# Patient Record
Sex: Female | Born: 1970 | Race: White | Hispanic: No | Marital: Married | Smoking: Never smoker
Health system: Southern US, Community
[De-identification: ages and names within clinical notes are randomized; demographics above are authoritative.]

## PROBLEM LIST (undated history)

## (undated) DIAGNOSIS — E039 Hypothyroidism, unspecified: Secondary | ICD-10-CM

## (undated) DIAGNOSIS — K219 Gastro-esophageal reflux disease without esophagitis: Secondary | ICD-10-CM

## (undated) DIAGNOSIS — T753XXA Motion sickness, initial encounter: Secondary | ICD-10-CM

## (undated) DIAGNOSIS — F419 Anxiety disorder, unspecified: Secondary | ICD-10-CM

## (undated) DIAGNOSIS — T8859XA Other complications of anesthesia, initial encounter: Secondary | ICD-10-CM

## (undated) DIAGNOSIS — E119 Type 2 diabetes mellitus without complications: Secondary | ICD-10-CM

## (undated) DIAGNOSIS — M109 Gout, unspecified: Secondary | ICD-10-CM

## (undated) DIAGNOSIS — M1990 Unspecified osteoarthritis, unspecified site: Secondary | ICD-10-CM

## (undated) HISTORY — PX: ABDOMINAL HYSTERECTOMY: SHX81

## (undated) HISTORY — PX: BARIATRIC SURGERY: SHX1103

## (undated) HISTORY — PX: TONSILLECTOMY: SUR1361

## (undated) HISTORY — PX: CATARACT EXTRACTION, BILATERAL: SHX1313

## (undated) HISTORY — PX: CARPAL TUNNEL RELEASE: SHX101

---

## 2011-09-14 ENCOUNTER — Ambulatory Visit: Payer: Self-pay

## 2012-04-12 ENCOUNTER — Ambulatory Visit: Payer: Self-pay | Admitting: Internal Medicine

## 2012-04-12 LAB — URINALYSIS, COMPLETE
Bilirubin,UR: NEGATIVE
Ketone: NEGATIVE
Nitrite: NEGATIVE
Ph: 5 (ref 4.5–8.0)
Squamous Epithelial: NONE SEEN
WBC UR: >30 /HPF (ref 0–5)

## 2012-04-14 LAB — URINE CULTURE

## 2013-04-09 ENCOUNTER — Ambulatory Visit: Payer: Self-pay

## 2013-04-12 DIAGNOSIS — M179 Osteoarthritis of knee, unspecified: Secondary | ICD-10-CM | POA: Insufficient documentation

## 2015-02-17 DIAGNOSIS — G5602 Carpal tunnel syndrome, left upper limb: Secondary | ICD-10-CM | POA: Insufficient documentation

## 2016-10-18 ENCOUNTER — Encounter: Payer: Self-pay | Admitting: Emergency Medicine

## 2016-10-18 ENCOUNTER — Ambulatory Visit
Admission: EM | Admit: 2016-10-18 | Discharge: 2016-10-18 | Disposition: A | Discharge status: Home / Self Care | Payer: 59 | Attending: Family Medicine | Admitting: Family Medicine

## 2016-10-18 DIAGNOSIS — B349 Viral infection, unspecified: Secondary | ICD-10-CM

## 2016-10-18 HISTORY — DX: Type 2 diabetes mellitus without complications: E11.9

## 2016-10-18 HISTORY — DX: Unspecified osteoarthritis, unspecified site: M19.90

## 2016-10-18 HISTORY — DX: Gout, unspecified: M10.9

## 2016-10-18 HISTORY — DX: Anxiety disorder, unspecified: F41.9

## 2016-10-18 LAB — RAPID STREP SCREEN (MED CTR MEBANE ONLY): Streptococcus, Group A Screen (Direct): NEGATIVE

## 2016-10-18 MED ORDER — PREDNISONE 10 MG (21) PO TBPK: 10.0000 mg | ORAL_TABLET | Freq: Every day | ORAL | 0 refills | Status: DC

## 2016-10-18 NOTE — ED Triage Notes (Signed)
Patient c/o bodyaches and chills that started on Wed.  Patient denies fevers.

## 2016-10-18 NOTE — ED Provider Notes (Signed)
CSN: [id number]     Arrival date & time 10/18/16  0917 History   None    Chief Complaint  Patient presents with  . Generalized Body Aches   (Consider location/radiation/quality/duration/timing/severity/associated sxs/prior Treatment) Sore throat and body aches for 2 days. Was concerned due to there son was positive for the flu. Denies any n/v/d no fevers. Does not smoke. Has not taken anything PTA.       Past Medical History:  Diagnosis Date  . Anxiety   . Arthritis   . Diabetes mellitus without complication (HCC)   . Gout    Past Surgical History:  Procedure Laterality Date  . ABDOMINAL HYSTERECTOMY    . BARIATRIC SURGERY    . TONSILLECTOMY     History reviewed. No pertinent family history. Social History  Substance Use Topics  . Smoking status: Never Smoker  . Smokeless tobacco: Never Used  . Alcohol use Yes   OB History    No data available     Review of Systems  Constitutional: Negative.   HENT: Positive for rhinorrhea and sore throat.   Eyes: Negative.   Respiratory: Negative.   Cardiovascular: Negative.   Gastrointestinal: Negative.   Genitourinary: Negative.   Musculoskeletal:       Body aches generalized   Skin: Negative.   Neurological: Negative.     Allergies  Patient has no known allergies.  Home Medications   Prior to Admission medications   Medication Sig Start Date End Date Taking? Authorizing Provider  buPROPion (WELLBUTRIN) 75 MG tablet Take 75 mg by mouth 2 (two) times daily.   Yes Historical Provider, MD  Canagliflozin (INVOKANA PO) Take by mouth daily.   Yes Historical Provider, MD  clonazePAM (KLONOPIN) 0.5 MG tablet Take 0.5 mg by mouth as needed for anxiety.   Yes Historical Provider, MD  cyclobenzaprine (FLEXERIL) 10 MG tablet Take 10 mg by mouth 3 (three) times daily as needed for muscle spasms.   Yes Historical Provider, MD  metFORMIN (GLUCOPHAGE) 1000 MG tablet Take 1,000 mg by mouth 2 (two) times daily with a meal.   Yes  Historical Provider, MD  sertraline (ZOLOFT) 100 MG tablet Take 100 mg by mouth daily.   Yes Historical Provider, MD  predniSONE (STERAPRED UNI-PAK 21 TAB) 10 MG (21) TBPK tablet Take 1 tablet (10 mg total) by mouth daily. Take 6 tabs by mouth daily  for 2 days, then 5 tabs for 2 days, then 4 tabs for 2 days, then 3 tabs for 2 days, 2 tabs for 2 days, then 1 tab by mouth daily for 2 days 10/18/16   Tobi BastosMelanie A Neah Sporrer, NP   Meds Ordered and Administered this Visit  Medications - No data to display  BP 105/71 (BP Location: Left Arm)   Pulse 83   Temp 98.6 F (37 C) (Oral)   Resp 16   Ht 5\' 4"  (1.626 m)   Wt 160 lb (72.6 kg)   SpO2 98%   BMI 27.46 kg/m  No data found.   Physical Exam  Constitutional: She appears well-developed.  HENT:  Right Ear: External ear normal.  Left Ear: External ear normal.  Nose: Nose normal.  Mouth/Throat: Oropharynx is clear and moist.  Eyes: Pupils are equal, round, and reactive to light.  Neck: Normal range of motion.  Cardiovascular: Normal heart sounds.   Pulmonary/Chest: Effort normal and breath sounds normal.  Abdominal: Soft. Bowel sounds are normal.  Musculoskeletal: Normal range of motion.  Neurological: She is alert.  Skin: Skin is warm.    Urgent Care Course     Procedures (including critical care time)  Labs Review Labs Reviewed  RAPID STREP SCREEN (NOT AT [facility])  CULTURE, GROUP A STREP [facility])    Imaging Review No results found.           MDM   1. Viral illness    Your step is negative we will send for a culture and call you if it is positive.  You appear to have viral type symptoms We discussed you taking steroids and you have taken these in the past with no problems Monitor you sugars due to steroids can cause an elevation     Tobi Bastos, NP 10/18/16 1046

## 2016-10-18 NOTE — Discharge Instructions (Signed)
Your step is negative we will send for a culture and call you if it is positive.  You appear to have viral type symptoms We discussed you taking steroids and you have taken these in the past with no problems Monitor you sugars due to steroids can cause an elevation

## 2016-10-21 LAB — CULTURE, GROUP A STREP (THRC)

## 2017-02-17 DIAGNOSIS — D509 Iron deficiency anemia, unspecified: Secondary | ICD-10-CM | POA: Insufficient documentation

## 2018-05-03 ENCOUNTER — Ambulatory Visit
Admission: EM | Admit: 2018-05-03 | Discharge: 2018-05-03 | Disposition: A | Discharge status: Home / Self Care | Payer: Managed Care, Other (non HMO)

## 2018-05-03 ENCOUNTER — Ambulatory Visit (INDEPENDENT_AMBULATORY_CARE_PROVIDER_SITE_OTHER): Payer: Managed Care, Other (non HMO)

## 2018-05-03 DIAGNOSIS — M7712 Lateral epicondylitis, left elbow: Secondary | ICD-10-CM

## 2018-05-03 DIAGNOSIS — S59912A Unspecified injury of left forearm, initial encounter: Secondary | ICD-10-CM

## 2018-05-03 DIAGNOSIS — W19XXXA Unspecified fall, initial encounter: Secondary | ICD-10-CM

## 2018-05-03 MED ORDER — MELOXICAM 7.5 MG PO TABS: 7.5000 mg | ORAL_TABLET | Freq: Two times a day (BID) | ORAL | 0 refills | Status: AC

## 2018-05-03 NOTE — Discharge Instructions (Addendum)
Your imaging was negative for fracture today. Presentation is consistent with lateral epicondylitis. See handout. Advise rest, icing painful area, using compression ACE wrap. Also avoid grasping and lifting greater than 2 lbs until pain improves. F/u with PCP in 1 week or sooner for re-examination.Take meloxicam as prescribed and may also take Tylenol for additional pain relief.

## 2018-05-03 NOTE — ED Triage Notes (Signed)
Pt fell over a week ago when she tripped over a speed bump and tried to catch her self with her left arm and still having pain in the left arm. ROM makes the pain returns, hand and arm weakness. Has been taking ibuprofen with some slight relief.

## 2018-05-03 NOTE — ED Provider Notes (Signed)
[facility]    CSN: [id number] Arrival date & time: 05/03/18  4098     History   Chief Complaint Chief Complaint  Patient presents with  . Fall    HPI Lauren Phillips is a 47 y.o. female. Patient presents with 1 week of left forearm pain that is worsening. States she fell on her outstretched arm last week and has had pain of the left wrist and forearm since. Patient states she tripped over a speed bump when walking. Admits to pain with supination and pronation of elbow. Admits to painful gripping and weakness of the arm. Denies numbness/tingling. States she does have some swelling of her elbow. Pain is 8/10. She has been taking NSAIDs and icing the arm without relief. States she would like to have x-rays to make sure she did not break anything. Denies any other injuries and has no further complaints.  HPI   Past Medical History:  Diagnosis Date  . Anxiety   . Arthritis   . Diabetes mellitus without complication (HCC)   . Gout     There are no active problems to display for this patient.   Past Surgical History:  Procedure Laterality Date  . ABDOMINAL HYSTERECTOMY    . BARIATRIC SURGERY    . TONSILLECTOMY      OB History   None      Home Medications    Prior to Admission medications   Medication Sig Start Date End Date Taking? Authorizing Provider  buPROPion (WELLBUTRIN) 75 MG tablet Take 75 mg by mouth 2 (two) times daily.   Yes [provider]  Canagliflozin (INVOKANA PO) Take by mouth daily.   Yes [provider]  clonazePAM (KLONOPIN) 0.5 MG tablet Take 0.5 mg by mouth as needed for anxiety.   Yes [provider]  cyclobenzaprine (FLEXERIL) 10 MG tablet Take 10 mg by mouth 3 (three) times daily as needed for muscle spasms.   Yes [provider]  levothyroxine (SYNTHROID, LEVOTHROID) 50 MCG tablet TK 1 T PO QD 04/21/18  Yes [provider]  metFORMIN (GLUCOPHAGE) 1000 MG tablet Take 1,000 mg by mouth 2  (two) times daily with a meal.   Yes [provider]  sertraline (ZOLOFT) 100 MG tablet Take 100 mg by mouth daily.   Yes [provider]  predniSONE (STERAPRED UNI-PAK 21 TAB) 10 MG (21) TBPK tablet Take 1 tablet (10 mg total) by mouth daily. Take 6 tabs by mouth daily  for 2 days, then 5 tabs for 2 days, then 4 tabs for 2 days, then 3 tabs for 2 days, 2 tabs for 2 days, then 1 tab by mouth daily for 2 days 10/18/16   Coralyn Mark, NP    Family History No family history on file.  Social History Social History   Tobacco Use  . Smoking status: Never Smoker  . Smokeless tobacco: Never Used  Substance Use Topics  . Alcohol use: Yes  . Drug use: Not on file     Allergies   Patient has no known allergies.   Review of Systems Review of Systems  Constitutional: Negative for fatigue and fever.  Respiratory: Negative for shortness of breath.   Cardiovascular: Negative for chest pain.  Musculoskeletal: Positive for arthralgias (left elbow, left wrist) and joint swelling (lateral left elbow).  Skin: Negative for rash and wound.  Neurological: Positive for weakness (left arm). Negative for dizziness and headaches.  Hematological: Does not bruise/bleed easily.  Physical Exam Triage Vital Signs ED Triage Vitals  Enc Vitals Group     BP 05/03/18 0850 123/83     Pulse Rate 05/03/18 0850 77     Resp 05/03/18 0850 18     Temp 05/03/18 0850 98 F (36.7 C)     Temp Source 05/03/18 0850 Oral     SpO2 05/03/18 0850 97 %     Weight 05/03/18 0852 180 lb (81.6 kg)     Height --      Head Circumference --      Peak Flow --      Pain Score 05/03/18 0852 8     Pain Loc --      Pain Edu? --      Excl. in GC? --    No data found.  Updated Vital Signs BP 123/83 (BP Location: Right Arm)   Pulse 77   Temp 98 F (36.7 C) (Oral)   Resp 18   Wt 180 lb (81.6 kg)   SpO2 97%   BMI 30.90 kg/m       Physical Exam  Constitutional: She is oriented to person,  place, and time. She appears well-developed and well-nourished.  HENT:  Head: Normocephalic and atraumatic.  Eyes: Pupils are equal, round, and reactive to light. Conjunctivae are normal.  Neck: Neck supple.  Cardiovascular: Normal rate, regular rhythm and normal pulses.  No murmur heard. Pulmonary/Chest: Effort normal and breath sounds normal. She has no wheezes. She has no rales.  Musculoskeletal:  Left elbow: there is mild effusion and ecchymosis of the elbow at lateral epicondyle and extensonr tendons of forearm, Full ROM of elbow but has pain and guarding with supination>pronation. 4/5 grip strength compared to normal strength on right. NVI Left wrist: no effusion or ecchymosis. TTP distal ulnar process, full ROM of wrist, full strength of wrist. NVI Left hand: all normal Left shoulder: all normal  Neurological: She is alert and oriented to person, place, and time. No sensory deficit.  Skin: Skin is warm and dry.  Psychiatric: She has a normal mood and affect. Her behavior is normal.     UC Treatments / Results  Labs (all labs ordered are listed, but only abnormal results are displayed) Labs Reviewed - No data to display  EKG None  Radiology Dg Elbow Complete Left  Result Date: 05/03/2018 CLINICAL DATA:  Fall on outstretched arm with left elbow pain EXAM: LEFT ELBOW - COMPLETE 3+ VIEW COMPARISON:  None. FINDINGS: There is no evidence of fracture, dislocation, or joint effusion. There is no evidence of arthropathy or other focal bone abnormality. Soft tissues are unremarkable. IMPRESSION: No left elbow fracture, joint effusion or dislocation. Electronically Signed   By: Delbert Phenix M.D.   On: 05/03/2018 09:24   Dg Forearm Left  Result Date: 05/03/2018 CLINICAL DATA:  Recent fall.  Left forearm pain. EXAM: LEFT FOREARM - 2 VIEW COMPARISON:  None. FINDINGS: There is no evidence of fracture or other focal bone lesions. Soft tissues are unremarkable. IMPRESSION: Negative.  Electronically Signed   By: Delbert Phenix M.D.   On: 05/03/2018 09:25    Procedures Procedures (including critical care time)  Medications Ordered in UC Medications - No data to display  Initial Impression / Assessment and Plan / UC Course  I have reviewed the triage vital signs and the nursing notes.  Pertinent labs & imaging results that were available during my care of the patient were reviewed by me and considered in my medical decision  making (see chart for details).   Patient presented with tenderness of the distal ulna and lateral epicondyle of elbow post fall 1 week ago. Patient also had decreased strength of affected arm and painful ROM. Ketorolac injection offered for pain relief, but patient declined. Imaging of left elbow and forearm ordered. Imaging reviewed. Radiology reading negative for fractures or acute abnormality-agree with report. Condition consistent with lateral epicondylitis. Discussed results with patient and advised rest, ice, elevation and use of arm brace or sling until pain begins to improve. Take Meloxicam for inflammation and pain. F/u with PCP or ortho in 1 week (or sooner if worsens) for further detailed imaging to ensure no occult fracture.    Final Clinical Impressions(s) / UC Diagnoses   Final diagnoses:  Fall, initial encounter  Forearm injury, left, initial encounter   Discharge Instructions   None    ED Prescriptions    None     Controlled Substance Prescriptions [place] Controlled Substance Registry consulted? Not Applicable   Gareth Morganaves, Fannie Alomar B, PA-C 05/03/18 [id number]

## 2020-04-02 ENCOUNTER — Other Ambulatory Visit: Payer: Self-pay

## 2020-04-02 ENCOUNTER — Ambulatory Visit
Admission: EM | Admit: 2020-04-02 | Discharge: 2020-04-02 | Disposition: A | Discharge status: Home / Self Care | Payer: Managed Care, Other (non HMO) | Attending: Internal Medicine | Admitting: Internal Medicine

## 2020-04-02 ENCOUNTER — Encounter: Payer: Self-pay | Admitting: Emergency Medicine

## 2020-04-02 DIAGNOSIS — F419 Anxiety disorder, unspecified: Secondary | ICD-10-CM | POA: Insufficient documentation

## 2020-04-02 DIAGNOSIS — R0982 Postnasal drip: Secondary | ICD-10-CM | POA: Insufficient documentation

## 2020-04-02 DIAGNOSIS — E119 Type 2 diabetes mellitus without complications: Secondary | ICD-10-CM | POA: Insufficient documentation

## 2020-04-02 DIAGNOSIS — M1990 Unspecified osteoarthritis, unspecified site: Secondary | ICD-10-CM | POA: Diagnosis not present

## 2020-04-02 DIAGNOSIS — Z791 Long term (current) use of non-steroidal anti-inflammatories (NSAID): Secondary | ICD-10-CM | POA: Insufficient documentation

## 2020-04-02 DIAGNOSIS — Z79899 Other long term (current) drug therapy: Secondary | ICD-10-CM | POA: Insufficient documentation

## 2020-04-02 DIAGNOSIS — Z20822 Contact with and (suspected) exposure to covid-19: Secondary | ICD-10-CM | POA: Insufficient documentation

## 2020-04-02 DIAGNOSIS — Z7984 Long term (current) use of oral hypoglycemic drugs: Secondary | ICD-10-CM | POA: Diagnosis not present

## 2020-04-02 DIAGNOSIS — R05 Cough: Secondary | ICD-10-CM | POA: Insufficient documentation

## 2020-04-02 DIAGNOSIS — J069 Acute upper respiratory infection, unspecified: Secondary | ICD-10-CM | POA: Diagnosis not present

## 2020-04-02 MED ORDER — BENZONATATE 200 MG PO CAPS: 200.0000 mg | ORAL_CAPSULE | Freq: Two times a day (BID) | ORAL | 0 refills | Status: DC | PRN

## 2020-04-02 NOTE — ED Triage Notes (Signed)
Patient c/o cough, chest congestion and runny nose for 4-5 days.  Patient denies fevers.

## 2020-04-02 NOTE — Discharge Instructions (Addendum)
Try the saline rinses twice a day for 3-5 days Check your Mychart for Covid test results.

## 2020-04-02 NOTE — ED Provider Notes (Signed)
[facility]    CSN: [id number] Arrival date & time: 04/02/20  1446      History   Chief Complaint Chief Complaint  Patient presents with  . Cough    HPI Lauren Phillips is a 49 y.o. female. who presents with URI x 4 days and her cough is keeping herself up at night. Has been taking Dayquil for cough which helps, and something similar to Nyquil she is still coughing. Denies loss of taste or smell. Deneis fever or chills. Her R throat feels irritated from post nasal drainage.      Past Medical History:  Diagnosis Date  . Anxiety   . Arthritis   . Diabetes mellitus without complication (HCC)   . Gout     There are no problems to display for this patient.   Past Surgical History:  Procedure Laterality Date  . ABDOMINAL HYSTERECTOMY    . BARIATRIC SURGERY    . TONSILLECTOMY      OB History   No obstetric history on file.      Home Medications    Prior to Admission medications   Medication Sig Start Date End Date Taking? Authorizing Provider  buPROPion (WELLBUTRIN) 75 MG tablet Take 75 mg by mouth 2 (two) times daily.   Yes [provider]  clonazePAM (KLONOPIN) 0.5 MG tablet Take 0.5 mg by mouth as needed for anxiety.   Yes [provider]  FARXIGA 10 MG TABS tablet Take 10 mg by mouth daily. 03/30/20  Yes [provider]  levothyroxine (SYNTHROID, LEVOTHROID) 50 MCG tablet TK 1 T PO QD 04/21/18  Yes [provider]  meloxicam (MOBIC) 15 MG tablet Take 15 mg by mouth daily. 02/29/20  Yes [provider]  metFORMIN (GLUCOPHAGE) 1000 MG tablet Take 1,000 mg by mouth 2 (two) times daily with a meal.   Yes [provider]  sertraline (ZOLOFT) 100 MG tablet Take 100 mg by mouth daily.   Yes [provider]  benzonatate (TESSALON) 200 MG capsule Take 1 capsule (200 mg total) by mouth 2 (two) times daily as needed for cough. 04/02/20   Rodriguez-Southworth, Nettie Elm, PA-C  cyclobenzaprine (FLEXERIL) 10  MG tablet Take 10 mg by mouth 3 (three) times daily as needed for muscle spasms.    [provider]  omeprazole (PRILOSEC) 40 MG capsule Take 40 mg by mouth daily. 02/28/20   [provider]  Vitamin D, Ergocalciferol, (DRISDOL) 1.25 MG (50000 UNIT) CAPS capsule Take 50,000 Units by mouth once a week. 03/30/20   [provider]    Family History History reviewed. No pertinent family history.  Social History Social History   Tobacco Use  . Smoking status: Never Smoker  . Smokeless tobacco: Never Used  Vaping Use  . Vaping Use: Never used  Substance Use Topics  . Alcohol use: Yes  . Drug use: Not on file     Allergies   Patient has no known allergies.   Review of Systems Review of Systems  Constitutional: Negative for activity change, appetite change, chills, diaphoresis and fever.  HENT: Positive for congestion, postnasal drip, rhinorrhea and sore throat. Negative for ear discharge, ear pain and trouble swallowing.   Eyes: Negative for discharge.  Respiratory: Positive for cough. Negative for shortness of breath and wheezing.   Cardiovascular: Negative for chest pain.  Musculoskeletal: Negative for gait problem and myalgias.  Skin: Negative for rash.  Neurological: Negative for headaches.  Hematological: Negative for adenopathy.  Physical Exam Triage Vital Signs ED Triage Vitals  Enc Vitals Group     BP 04/02/20 1519 126/73     Pulse Rate 04/02/20 1519 84     Resp 04/02/20 1519 16     Temp 04/02/20 1519 98.6 F (37 C)     Temp Source 04/02/20 1519 Oral     SpO2 --      Weight 04/02/20 1514 180 lb (81.6 kg)     Height 04/02/20 1514 5\' 4"  (1.626 m)     Head Circumference --      Peak Flow --      Pain Score 04/02/20 1514 0     Pain Loc --      Pain Edu? --      Excl. in GC? --    No data found.  Updated Vital Signs BP 126/73 (BP Location: Right Arm)   Pulse 84   Temp 98.6 F (37 C) (Oral)   Resp 16   Ht 5\' 4"  (1.626 m)   Wt  180 lb (81.6 kg)   BMI 30.90 kg/m   Visual Acuity Right Eye Distance:   Left Eye Distance:   Bilateral Distance:    Right Eye Near:   Left Eye Near:    Bilateral Near:     Physical Exam Vitals and nursing note reviewed.  Constitutional:      General: She is not in acute distress.    Appearance: She is obese. She is not toxic-appearing.  HENT:     Head: Atraumatic.     Right Ear: Tympanic membrane, ear canal and external ear normal.     Left Ear: Tympanic membrane, ear canal and external ear normal.     Nose: Congestion and rhinorrhea present.  Eyes:     General: No scleral icterus.    Conjunctiva/sclera: Conjunctivae normal.  Cardiovascular:     Rate and Rhythm: Normal rate and regular rhythm.  Pulmonary:     Effort: Pulmonary effort is normal.     Breath sounds: Normal breath sounds.  Musculoskeletal:        General: Normal range of motion.     Cervical back: Neck supple.  Skin:    General: Skin is warm and dry.  Neurological:     Mental Status: She is alert and oriented to person, place, and time.     Gait: Gait normal.  Psychiatric:        Mood and Affect: Mood normal.        Behavior: Behavior normal.        Thought Content: Thought content normal.        Judgment: Judgment normal.      UC Treatments / Results  Labs (all labs ordered are listed, but only abnormal results are displayed) Labs Reviewed  SARS CORONAVIRUS 2 (TAT 6-24 HRS)    EKG   Radiology No results found.  Procedures Procedures (including critical care time)  Medications Ordered in UC Medications - No data to display  Initial Impression / Assessment and Plan / UC Course  I have reviewed the triage vital signs and the nursing notes. Covid test is pending She was educated how to do saline nose rinses and advised to try this for a few days. I prescribed her Tessalon perless for the cough.  Final Clinical Impressions(s) / UC Diagnoses   Final diagnoses:  Viral URI with cough      Discharge Instructions     Try the saline rinses twice a day for  3-5 days Check your Mychart for Covid test results.     ED Prescriptions    Medication Sig Dispense Auth. Provider   benzonatate (TESSALON) 200 MG capsule Take 1 capsule (200 mg total) by mouth 2 (two) times daily as needed for cough. 30 capsule Rodriguez-Southworth, Nettie Elm, PA-C     PDMP not reviewed this encounter.   Garey Ham, [place] 04/02/20 1557

## 2020-04-03 LAB — SARS CORONAVIRUS 2 (TAT 6-24 HRS): SARS Coronavirus 2: NEGATIVE

## 2020-06-16 ENCOUNTER — Other Ambulatory Visit: Payer: Self-pay

## 2020-06-16 ENCOUNTER — Ambulatory Visit
Admission: RE | Admit: 2020-06-16 | Discharge: 2020-06-16 | Disposition: A | Discharge status: Home / Self Care | Payer: Managed Care, Other (non HMO) | Source: Ambulatory Visit | Attending: Family Medicine | Admitting: Family Medicine

## 2020-06-16 VITALS — BP 130/77 | HR 80 | Temp 98.5°F | Resp 18 | Ht 64.0 in | Wt 180.0 lb

## 2020-06-16 DIAGNOSIS — H109 Unspecified conjunctivitis: Secondary | ICD-10-CM | POA: Diagnosis not present

## 2020-06-16 DIAGNOSIS — J029 Acute pharyngitis, unspecified: Secondary | ICD-10-CM | POA: Diagnosis not present

## 2020-06-16 LAB — GROUP A STREP BY PCR: Group A Strep by PCR: NOT DETECTED

## 2020-06-16 MED ORDER — CETIRIZINE HCL 10 MG PO TABS
10.0000 mg | ORAL_TABLET | Freq: Every day | ORAL | 0 refills | Status: AC
Start: 2020-06-16 — End: ?

## 2020-06-16 MED ORDER — CIPROFLOXACIN HCL 0.3 % OP SOLN: OPHTHALMIC | 0 refills | Status: DC

## 2020-06-16 MED ORDER — IPRATROPIUM BROMIDE 0.06 % NA SOLN: 2.0000 | Freq: Four times a day (QID) | NASAL | 0 refills | Status: AC | PRN

## 2020-06-16 NOTE — Discharge Instructions (Signed)
Medications as prescribed. ° °Take care ° °Dr. Tannar Broker  °

## 2020-06-16 NOTE — ED Provider Notes (Signed)
[facility]    CSN: [id number] Arrival date & time: 06/16/20  1052  History   Chief Complaint Chief Complaint  Patient presents with  . Appointment  . Sore Throat  . Conjunctivitis    right   HPI  49 year old female presents with the above complaints.  Patient reports a 3-week history of sore throat.  She states that this has been mild.  No medications or interventions tried.  Patient reports that today she developed right eye redness, swelling, and discharge.  She is concerned she has conjunctivitis.  Declines Covid testing.  No sick contacts.  No relieving factors.  No other complaints.  Past Medical History:  Diagnosis Date  . Anxiety   . Arthritis   . Diabetes mellitus without complication (HCC)   . Gout    Past Surgical History:  Procedure Laterality Date  . ABDOMINAL HYSTERECTOMY    . BARIATRIC SURGERY    . CARPAL TUNNEL RELEASE Bilateral   . CATARACT EXTRACTION, BILATERAL    . TONSILLECTOMY     OB History   No obstetric history on file.    Home Medications    Prior to Admission medications   Medication Sig Start Date End Date Taking? Authorizing Provider  buPROPion (WELLBUTRIN) 75 MG tablet Take 75 mg by mouth 2 (two) times daily.   Yes [provider]  clonazePAM (KLONOPIN) 0.5 MG tablet Take 0.5 mg by mouth as needed for anxiety.   Yes [provider]  cyclobenzaprine (FLEXERIL) 10 MG tablet Take 10 mg by mouth 3 (three) times daily as needed for muscle spasms.   Yes [provider]  FARXIGA 10 MG TABS tablet Take 10 mg by mouth daily. 03/30/20  Yes [provider]  levothyroxine (SYNTHROID, LEVOTHROID) 50 MCG tablet TK 1 T PO QD 04/21/18  Yes [provider]  meloxicam (MOBIC) 15 MG tablet Take 15 mg by mouth daily. 02/29/20  Yes [provider]  metFORMIN (GLUCOPHAGE) 1000 MG tablet Take 1,000 mg by mouth 2 (two) times daily with a meal.   Yes [provider]  omeprazole (PRILOSEC) 40  MG capsule Take 40 mg by mouth daily. 02/28/20  Yes [provider]  sertraline (ZOLOFT) 100 MG tablet Take 100 mg by mouth daily.   Yes [provider]  Vitamin D, Ergocalciferol, (DRISDOL) 1.25 MG (50000 UNIT) CAPS capsule Take 50,000 Units by mouth once a week. 03/30/20  Yes [provider]  cetirizine (ZYRTEC ALLERGY) 10 MG tablet Take 1 tablet (10 mg total) by mouth daily. 06/16/20   Tommie Sams, DO  ciprofloxacin (CILOXAN) 0.3 % ophthalmic solution Administer 1 drop, every 2 hours, while awake, for 2 days. Then 1 drop, every 4 hours, while awake, for the next 5 days. 06/16/20   Everlene Other G, DO  ipratropium (ATROVENT) 0.06 % nasal spray Place 2 sprays into both nostrils 4 (four) times daily as needed for rhinitis. 06/16/20   Tommie Sams, DO    Family History Family History  Problem Relation Age of Onset  . Osteoarthritis Mother   . Neuropathy Mother   . Diabetes Mother        pre-diabetes  . Melanoma Mother   . Prostate cancer Father   . Diabetes Father   . Hypertension Father   . Gout Father     Social History Social History   Tobacco Use  . Smoking status: Never Smoker  . Smokeless tobacco: Never Used  Vaping Use  . Vaping Use:  Never used  Substance Use Topics  . Alcohol use: Yes    Comment: rare  . Drug use: Never     Allergies   Patient has no known allergies.   Review of Systems Review of Systems  Constitutional: Negative.   HENT: Positive for sore throat.   Eyes: Positive for discharge and redness.   Physical Exam Triage Vital Signs ED Triage Vitals  Enc Vitals Group     BP 06/16/20 1115 130/77     Pulse Rate 06/16/20 1115 80     Resp 06/16/20 1115 18     Temp 06/16/20 1115 98.5 F (36.9 C)     Temp Source 06/16/20 1115 Oral     SpO2 06/16/20 1115 98 %     Weight 06/16/20 1117 180 lb (81.6 kg)     Height 06/16/20 1117 5\' 4"  (1.626 m)     Head Circumference --      Peak Flow --      Pain Score 06/16/20 1116 1      Pain Loc --      Pain Edu? --      Excl. in GC? --    Updated Vital Signs BP 130/77 (BP Location: Left Arm)   Pulse 80   Temp 98.5 F (36.9 C) (Oral)   Resp 18   Ht 5\' 4"  (1.626 m)   Wt 81.6 kg   SpO2 98%   BMI 30.90 kg/m   Visual Acuity Right Eye Distance: 20/25 Left Eye Distance: 20/25 Bilateral Distance: 20/20  Right Eye Near:   Left Eye Near:    Bilateral Near:     Physical Exam Vitals and nursing note reviewed.  Constitutional:      General: She is not in acute distress.    Appearance: Normal appearance. She is not ill-appearing.  HENT:     Head: Normocephalic and atraumatic.     Mouth/Throat:     Pharynx: Oropharynx is clear. No oropharyngeal exudate or posterior oropharyngeal erythema.  Eyes:     General:        Right eye: Discharge present.     Comments: Conjunctival injection noted of the right eye.  Cardiovascular:     Rate and Rhythm: Normal rate and regular rhythm.     Heart sounds: No murmur heard.   Neurological:     Mental Status: She is alert.  Psychiatric:        Mood and Affect: Mood normal.        Behavior: Behavior normal.    UC Treatments / Results  Labs (all labs ordered are listed, but only abnormal results are displayed) Labs Reviewed  GROUP A STREP BY PCR    EKG   Radiology No results found.  Procedures Procedures (including critical care time)  Medications Ordered in UC Medications - No data to display  Initial Impression / Assessment and Plan / UC Course  I have reviewed the triage vital signs and the nursing notes.  Pertinent labs & imaging results that were available during my care of the patient were reviewed by me and considered in my medical decision making (see chart for details).    49 year old female presents with conjunctivitis.  Strep PCR negative.  I suspect that her sore throat is from postnasal drip.  Treating with Zyrtec and Atrovent nasal spray.  Cipro ophthalmic for conjunctivitis.  Final Clinical  Impressions(s) / UC Diagnoses   Final diagnoses:  Bacterial conjunctivitis  Acute pharyngitis, unspecified etiology     Discharge  Instructions     Medications as prescribed.  Take care  Dr. Adriana Simas    ED Prescriptions    Medication Sig Dispense Auth. Provider   ciprofloxacin (CILOXAN) 0.3 % ophthalmic solution Administer 1 drop, every 2 hours, while awake, for 2 days. Then 1 drop, every 4 hours, while awake, for the next 5 days. 5 mL Junice Fei G, DO   cetirizine (ZYRTEC ALLERGY) 10 MG tablet Take 1 tablet (10 mg total) by mouth daily. 30 tablet Annamae Shivley G, DO   ipratropium (ATROVENT) 0.06 % nasal spray Place 2 sprays into both nostrils 4 (four) times daily as needed for rhinitis. 15 mL Tommie Sams, DO     PDMP not reviewed this encounter.   Tommie Sams, DO 06/16/20 1505

## 2020-06-16 NOTE — ED Triage Notes (Signed)
Patient in today c/o sore throat x 3 weeks and red, swollen right eye x 1 day. Patient has not taken any OTC medications. Patient has had the covid vaccines. Patient declines covid testing at this time.

## 2020-06-23 ENCOUNTER — Other Ambulatory Visit: Payer: Self-pay | Admitting: Family Medicine

## 2021-03-05 ENCOUNTER — Encounter: Payer: Self-pay | Admitting: Gastroenterology

## 2021-03-05 ENCOUNTER — Other Ambulatory Visit: Payer: Self-pay

## 2021-03-05 ENCOUNTER — Ambulatory Visit: Payer: Managed Care, Other (non HMO) | Admitting: Gastroenterology

## 2021-03-05 VITALS — BP 115/73 | HR 86 | Temp 98.3°F | Ht 64.0 in | Wt 182.2 lb

## 2021-03-05 DIAGNOSIS — K219 Gastro-esophageal reflux disease without esophagitis: Secondary | ICD-10-CM | POA: Diagnosis not present

## 2021-03-05 DIAGNOSIS — G8929 Other chronic pain: Secondary | ICD-10-CM

## 2021-03-05 DIAGNOSIS — Z1211 Encounter for screening for malignant neoplasm of colon: Secondary | ICD-10-CM

## 2021-03-05 DIAGNOSIS — R1013 Epigastric pain: Secondary | ICD-10-CM

## 2021-03-05 MED ORDER — SUPREP BOWEL PREP KIT 17.5-3.13-1.6 GM/177ML PO SOLN: 1.0000 | ORAL | 0 refills | Status: DC

## 2021-03-05 NOTE — Progress Notes (Signed)
Gastroenterology Consultation  Referring Provider:     Towana Badger, NP Primary Care Physician:  Leda Quail, PA Primary Gastroenterologist:  Dr. Servando Snare     Reason for Consultation:     Epigastric pain        HPI:   Lauren Phillips is a 50 y.o. y/o female referred for consultation & management of epigastric pain by Dr. Leda Quail, PA.  This patient comes in today after being seen by her primary care provider back in May of this year.  Her primary care provider is in [place] and the patient had reported that she was having epigastric pain.  The primary care provider's notes report that the patient was also due for screening colonoscopy. The patient reports that she is status post gastric bypass surgery and she has lost 200 pounds.  The patient also reports that her symptoms started shortly after having Covid.  During her infection with Covid she reports that she had a lot of coughing.  She also reports that everybody in the household had gotten sick.  This was despite her being vaccinated.  There is no report of any black stools or bloody stools. She reports that her abdominal pain has been better since she started taking omeprazole.  Past Medical History:  Diagnosis Date   Anxiety    Arthritis    Diabetes mellitus without complication (HCC)    Gout     Past Surgical History:  Procedure Laterality Date   ABDOMINAL HYSTERECTOMY     BARIATRIC SURGERY     CARPAL TUNNEL RELEASE Bilateral    CATARACT EXTRACTION, BILATERAL     TONSILLECTOMY      Prior to Admission medications   Medication Sig Start Date End Date Taking? Authorizing Provider  BREO ELLIPTA 100-25 MCG/INH AEPB 1 puff daily. 02/07/21   [provider]  buPROPion (WELLBUTRIN SR) 150 MG 12 hr tablet Take 150 mg by mouth 2 (two) times daily. 01/25/21   [provider]  buPROPion (WELLBUTRIN) 75 MG tablet Take 75 mg by mouth 2 (two) times daily.    [provider]  canagliflozin (INVOKANA)  300 MG TABS tablet Take by mouth. 01/20/15   [provider]  cetirizine (ZYRTEC ALLERGY) 10 MG tablet Take 1 tablet (10 mg total) by mouth daily. 06/16/20   Tommie Sams, DO  ciprofloxacin (CILOXAN) 0.3 % ophthalmic solution Administer 1 drop, every 2 hours, while awake, for 2 days. Then 1 drop, every 4 hours, while awake, for the next 5 days. 06/16/20   Tommie Sams, DO  clonazePAM (KLONOPIN) 0.5 MG tablet Take 0.5 mg by mouth as needed for anxiety.    [provider]  cyclobenzaprine (FLEXERIL) 10 MG tablet Take 10 mg by mouth 3 (three) times daily as needed for muscle spasms.    [provider]  cyclobenzaprine (FLEXERIL) 5 MG tablet Take 5 mg by mouth 3 (three) times daily as needed. 01/25/21   [provider]  FARXIGA 10 MG TABS tablet Take 10 mg by mouth daily. 03/30/20   [provider]  ipratropium (ATROVENT) 0.06 % nasal spray Place 2 sprays into both nostrils 4 (four) times daily as needed for rhinitis. 06/16/20   Tommie Sams, DO  levothyroxine (SYNTHROID, LEVOTHROID) 50 MCG tablet TK 1 T PO QD 04/21/18   [provider]  meloxicam (MOBIC) 15 MG tablet Take 15 mg by mouth daily. 02/29/20   [provider]  metFORMIN (GLUCOPHAGE) 1000 MG tablet Take 1,000  mg by mouth 2 (two) times daily with a meal.    [provider]  metFORMIN (GLUCOPHAGE) 500 MG tablet Take 1,000 mg by mouth 2 (two) times daily. 01/25/21   [provider]  omeprazole (PRILOSEC) 40 MG capsule Take 40 mg by mouth daily. 02/28/20   [provider]  sertraline (ZOLOFT) 100 MG tablet Take 100 mg by mouth daily.    [provider]  Vitamin D, Ergocalciferol, (DRISDOL) 1.25 MG (50000 UNIT) CAPS capsule Take 50,000 Units by mouth once a week. 03/30/20   [provider]    Family History  Problem Relation Age of Onset   Osteoarthritis Mother    Neuropathy Mother    Diabetes Mother        pre-diabetes   Melanoma Mother     Prostate cancer Father    Diabetes Father    Hypertension Father    Gout Father      Social History   Tobacco Use   Smoking status: Never   Smokeless tobacco: Never  Vaping Use   Vaping Use: Never used  Substance Use Topics   Alcohol use: Yes    Comment: rare   Drug use: Never    Allergies as of 03/05/2021   (No Known Allergies)    Review of Systems:    All systems reviewed and negative except where noted in HPI.   Physical Exam:  There were no vitals taken for this visit. No LMP recorded. Patient has had a hysterectomy. General:   Alert,  Well-developed, well-nourished, pleasant and cooperative in NAD Head:  Normocephalic and atraumatic. Eyes:  Sclera clear, no icterus.   Conjunctiva pink. Ears:  Normal auditory acuity. Neck:  Supple; no masses or thyromegaly. Lungs:  Respirations even and unlabored.  Clear throughout to auscultation.   No wheezes, crackles, or rhonchi. No acute distress. Heart:  Regular rate and rhythm; no murmurs, clicks, rubs, or gallops. Abdomen:  Normal bowel sounds.  No bruits.  Soft, Positive tenderness one finger palpation while flexing the abdominal wall  muscles and non-distended without masses, hepatosplenomegaly or hernias noted.  No guarding or rebound tenderness.  Positive Carnett sign.   Rectal:  Deferred.  Pulses:  Normal pulses noted. Extremities:  No clubbing or edema.  No cyanosis. Neurologic:  Alert and oriented x3;  grossly normal neurologically. Skin:  Intact without significant lesions or rashes.  No jaundice. Lymph Nodes:  No significant cervical adenopathy. Psych:  Alert and cooperative. Normal mood and affect.  Imaging Studies: No results found.  Assessment and Plan:   Lauren Phillips is a 50 y.o. y/o female Who comes in with a history of gastric bypass surgery and significant weight loss from the surgery.  The patient has had no recent weight loss.  The patient presented with epigastric pain that has been getting better  recently.  The pain is reproducible with 1 finger palpation while flexing the abdominal wall muscles.  Due to the patient's history of GERD with improvement with a PPI the pain may be multifactorial.  The patient will be set up for an EGD and will also except for screening colonoscopy.  The patient has been explained the plan and agrees with it.    [name], MD. Clementeen Graham    Note: This dictation was prepared with Dragon dictation along with smaller phrase technology. Any transcriptional errors that result from this process are unintentional.

## 2021-03-06 ENCOUNTER — Encounter: Payer: Self-pay | Admitting: Gastroenterology

## 2021-03-13 ENCOUNTER — Other Ambulatory Visit: Payer: Self-pay

## 2021-03-13 MED ORDER — PEG 3350-KCL-NA BICARB-NACL 420 G PO SOLR: ORAL | 0 refills | Status: AC

## 2021-03-16 ENCOUNTER — Encounter: Payer: Self-pay | Admitting: Gastroenterology

## 2021-03-16 ENCOUNTER — Ambulatory Visit
Admission: RE | Admit: 2021-03-16 | Discharge: 2021-03-16 | Disposition: A | Discharge status: Home / Self Care | Payer: Managed Care, Other (non HMO) | Attending: Gastroenterology | Admitting: Gastroenterology

## 2021-03-16 ENCOUNTER — Encounter
Admission: RE | Disposition: A | Discharge status: Home / Self Care | Payer: Self-pay | Source: Home / Self Care | Attending: Gastroenterology

## 2021-03-16 ENCOUNTER — Ambulatory Visit: Payer: Managed Care, Other (non HMO) | Admitting: Anesthesiology

## 2021-03-16 ENCOUNTER — Other Ambulatory Visit: Payer: Self-pay

## 2021-03-16 DIAGNOSIS — Z79899 Other long term (current) drug therapy: Secondary | ICD-10-CM | POA: Diagnosis not present

## 2021-03-16 DIAGNOSIS — R1013 Epigastric pain: Secondary | ICD-10-CM | POA: Diagnosis not present

## 2021-03-16 DIAGNOSIS — Z7984 Long term (current) use of oral hypoglycemic drugs: Secondary | ICD-10-CM | POA: Diagnosis not present

## 2021-03-16 DIAGNOSIS — Z1211 Encounter for screening for malignant neoplasm of colon: Secondary | ICD-10-CM | POA: Diagnosis present

## 2021-03-16 DIAGNOSIS — K219 Gastro-esophageal reflux disease without esophagitis: Secondary | ICD-10-CM | POA: Diagnosis not present

## 2021-03-16 DIAGNOSIS — Z90710 Acquired absence of both cervix and uterus: Secondary | ICD-10-CM | POA: Diagnosis not present

## 2021-03-16 DIAGNOSIS — T182XXA Foreign body in stomach, initial encounter: Secondary | ICD-10-CM | POA: Diagnosis not present

## 2021-03-16 DIAGNOSIS — X58XXXA Exposure to other specified factors, initial encounter: Secondary | ICD-10-CM | POA: Diagnosis not present

## 2021-03-16 DIAGNOSIS — Z79890 Hormone replacement therapy (postmenopausal): Secondary | ICD-10-CM | POA: Diagnosis not present

## 2021-03-16 DIAGNOSIS — K648 Other hemorrhoids: Secondary | ICD-10-CM | POA: Diagnosis not present

## 2021-03-16 DIAGNOSIS — Z9884 Bariatric surgery status: Secondary | ICD-10-CM | POA: Insufficient documentation

## 2021-03-16 DIAGNOSIS — Z7951 Long term (current) use of inhaled steroids: Secondary | ICD-10-CM | POA: Insufficient documentation

## 2021-03-16 DIAGNOSIS — Z791 Long term (current) use of non-steroidal anti-inflammatories (NSAID): Secondary | ICD-10-CM | POA: Insufficient documentation

## 2021-03-16 HISTORY — PX: COLONOSCOPY WITH PROPOFOL: SHX5780

## 2021-03-16 HISTORY — DX: Other complications of anesthesia, initial encounter: T88.59XA

## 2021-03-16 HISTORY — PX: ESOPHAGOGASTRODUODENOSCOPY (EGD) WITH PROPOFOL: SHX5813

## 2021-03-16 HISTORY — DX: Gastro-esophageal reflux disease without esophagitis: K21.9

## 2021-03-16 HISTORY — DX: Hypothyroidism, unspecified: E03.9

## 2021-03-16 HISTORY — DX: Motion sickness, initial encounter: T75.3XXA

## 2021-03-16 LAB — GLUCOSE, CAPILLARY
Glucose-Capillary: 145 mg/dL — ABNORMAL HIGH (ref 70–99)
Glucose-Capillary: 137 mg/dL — ABNORMAL HIGH (ref 70–99)

## 2021-03-16 SURGERY — COLONOSCOPY WITH PROPOFOL
Anesthesia: General | Site: Throat

## 2021-03-16 MED ORDER — PROPOFOL 10 MG/ML IV BOLUS
INTRAVENOUS | Status: DC | PRN
  Administered 2021-03-16 (×2): 50 mg via INTRAVENOUS
  Administered 2021-03-16 (×2): 40 mg via INTRAVENOUS
  Administered 2021-03-16: 150 mg via INTRAVENOUS
  Administered 2021-03-16: 50 mg via INTRAVENOUS
  Administered 2021-03-16 (×3): 40 mg via INTRAVENOUS
  Administered 2021-03-16: 50 mg via INTRAVENOUS
  Administered 2021-03-16: 40 mg via INTRAVENOUS
  Administered 2021-03-16: 50 mg via INTRAVENOUS

## 2021-03-16 MED ORDER — LACTATED RINGERS IV SOLN: INTRAVENOUS | Status: DC

## 2021-03-16 MED ORDER — SODIUM CHLORIDE 0.9 % IV SOLN: INTRAVENOUS | Status: DC

## 2021-03-16 MED ORDER — STERILE WATER FOR IRRIGATION IR SOLN
Status: DC | PRN
  Administered 2021-03-16: .05 mL

## 2021-03-16 SURGICAL SUPPLY — 8 items
BLOCK BITE 60FR ADLT L/F GRN (MISCELLANEOUS) ×3 IMPLANT
FORCEPS BIOP RAD 4 LRG CAP 4 (CUTTING FORCEPS) ×3 IMPLANT
GOWN CVR UNV OPN BCK APRN NK (MISCELLANEOUS) ×8 IMPLANT
GOWN ISOL THUMB LOOP REG UNIV (MISCELLANEOUS) ×12
KIT PRC NS LF DISP ENDO (KITS) ×4 IMPLANT
KIT PROCEDURE OLYMPUS (KITS) ×6
MANIFOLD NEPTUNE II (INSTRUMENTS) ×6 IMPLANT
WATER STERILE IRR 250ML POUR (IV SOLUTION) ×6 IMPLANT

## 2021-03-16 NOTE — Anesthesia Preprocedure Evaluation (Signed)
Anesthesia Evaluation  Patient identified by MRN, date of birth, ID band Patient awake    Reviewed: Allergy & Precautions, NPO status   Airway Mallampati: II  TM Distance: >3 FB     Dental   Pulmonary    Pulmonary exam normal        Cardiovascular  Rhythm:Regular Rate:Normal     Neuro/Psych Anxiety    GI/Hepatic GERD  ,Hx gastric bypass   Endo/Other  diabetesHypothyroidism Obesity - BMI 31  Renal/GU      Musculoskeletal  (+) Arthritis , Gout   Abdominal   Peds  Hematology   Anesthesia Other Findings   Reproductive/Obstetrics                             Anesthesia Physical Anesthesia Plan  ASA: 2  Anesthesia Plan: General   Post-op Pain Management:    Induction: Intravenous  PONV Risk Score and Plan: Propofol infusion, TIVA and Treatment may vary due to age or medical condition  Airway Management Planned: Natural Airway and Nasal Cannula  Additional Equipment:   Intra-op Plan:   Post-operative Plan:   Informed Consent: I have reviewed the patients History and Physical, chart, labs and discussed the procedure including the risks, benefits and alternatives for the proposed anesthesia with the patient or authorized representative who has indicated his/her understanding and acceptance.       Plan Discussed with: CRNA  Anesthesia Plan Comments:         Anesthesia Quick Evaluation

## 2021-03-16 NOTE — Anesthesia Procedure Notes (Signed)
Date/Time: 03/16/2021 8:23 AM Performed by: Maree Krabbe, CRNA Pre-anesthesia Checklist: Patient identified, Emergency Drugs available, Suction available, Timeout performed and Patient being monitored Patient Re-evaluated:Patient Re-evaluated prior to induction Oxygen Delivery Method: Nasal cannula Placement Confirmation: positive ETCO2

## 2021-03-16 NOTE — Anesthesia Postprocedure Evaluation (Signed)
Anesthesia Post Note  Patient: Lauren Phillips  Procedure(s) Performed: COLONOSCOPY WITH PROPOFOL (Rectum) ESOPHAGOGASTRODUODENOSCOPY (EGD) WITH PROPOFOL (Throat)     Patient location during evaluation: PACU Anesthesia Type: General Level of consciousness: awake Pain management: pain level controlled Vital Signs Assessment: post-procedure vital signs reviewed and stable Respiratory status: respiratory function stable Cardiovascular status: stable Postop Assessment: no signs of nausea or vomiting Anesthetic complications: no   No notable events documented.  Jola Babinski

## 2021-03-16 NOTE — Op Note (Signed)
[facility] Patient Name: Lauren Phillips Procedure Date: 03/16/2021 7:55 AM MRN: [id number] Account #: [id number] Date of Birth: September 19, 1970 Admit Type: Outpatient Age: 50 Room: [facility] OR ROOM 01 Gender: Female Note Status: Finalized Procedure:             PColonoscopy Indications:           Screening for colorectal malignant neoplasm Providers:             Ardith Dark MD, MD Referring MD:          Leda Quail, PA Medicines:             Propofol per Anesthesia Complications:         No immediate complications. Procedure:             Pre-Anesthesia Assessment:                        - Prior to the procedure, a History and Physical was                         performed, and patient medications and allergies were                         reviewed. The patient's tolerance of previous                         anesthesia was also reviewed. The risks and benefits                         of the procedure and the sedation options and risks                         were discussed with the patient. All questions were                         answered, and informed consent was obtained. Prior                         Anticoagulants: The patient has taken no previous                         anticoagulant or antiplatelet agents. ASA Grade                         Assessment: II - A patient with mild systemic disease.                         After reviewing the risks and benefits, the patient                         was deemed in satisfactory condition to undergo the                         procedure.                        After obtaining informed consent, the colonoscope was  passed under direct vision. Throughout the procedure,                         the patient's blood pressure, pulse, and oxygen                         saturations were monitored continuously. The                         Colonoscope was introduced through the anus and                          advanced to the the cecum, identified by appendiceal                         orifice and ileocecal valve. The colonoscopy was                         performed without difficulty. The patient tolerated                         the procedure well. The quality of the bowel                         preparation was excellent. Findings:      The perianal and digital rectal examinations were normal.      Non-bleeding internal hemorrhoids were found during retroflexion. The       hemorrhoids were Grade I (internal hemorrhoids that do not prolapse). Impression:            - Non-bleeding internal hemorrhoids.                        - No specimens collected. Recommendation:        - Discharge patient to home.                        - Resume previous diet.                        - Continue present medications.                        - Repeat colonoscopy in 10 years for screening                         purposes. Procedure Code(s):     --- Professional ---                        [id number], Colonoscopy, flexible; diagnostic, including                         collection of specimen(s) by brushing or washing, when                         performed (separate procedure) Diagnosis Code(s):     --- Professional ---                        Z12.11, Encounter for screening for malignant neoplasm  of colon CPT copyright 2019 American [employer]. All rights reserved. The codes documented in this report are preliminary and upon coder review may  be revised to meet current compliance requirements. [name] MD, MD 03/16/2021 8:42:26 AM This report has been signed electronically. Number of Addenda: 0 Note Initiated On: 03/16/2021 7:55 AM Scope Withdrawal Time: 0 hours 9 minutes 0 seconds  Total Procedure Duration: 0 hours 24 minutes 50 seconds  Estimated Blood Loss:  Estimated blood loss: none.      [facility]

## 2021-03-16 NOTE — Op Note (Signed)
[facility]astroenter[facility] Patient Name: Lauren Phillips Procedure Date: 03/16/2021 7:58 AM MRN: [id number] Account #: [id number] Date of Birth: 1971/02/10 Admit Type: Outpatient Age: 50 Room: [facility] OR ROOM 1 Gender: Female Note Status: Finalized Procedure:             Upper GI endoscopy Indications:           Epigastric abdominal pain, Heartburn Providers:             [name] MD, MD Referring MD:          Leda Quail, PA Medicines:             Propofol per Anesthesia Complications:         No immediate complications. Procedure:             Pre-Anesthesia Assessment:                        - Prior to the procedure, a History and Physical was                         performed, and patient medications and allergies were                         reviewed. The patient's tolerance of previous                         anesthesia was also reviewed. The risks and benefits                         of the procedure and the sedation options and risks                         were discussed with the patient. All questions were                         answered, and informed consent was obtained. Prior                         Anticoagulants: The patient has taken no previous                         anticoagulant or antiplatelet agents. ASA Grade                         Assessment: II - A patient with mild systemic disease.                         After reviewing the risks and benefits, the patient                         was deemed in satisfactory condition to undergo the                         procedure.                        After obtaining informed consent, the endoscope was  passed under direct vision. Throughout the procedure,                         the patient's blood pressure, pulse, and oxygen                         saturations were monitored continuously. The                         Endosonoscope was introduced through the mouth, and                          advanced to the jejunum. The upper GI endoscopy was                         accomplished without difficulty. The patient tolerated                         the procedure well. Findings:      The examined esophagus was normal.      Evidence of a Roux-en-Y gastrojejunostomy was found. The gastrojejunal       anastomosis was characterized by an intact staple line. This was       traversed.      A staple was found at the anastomosis. Removal was accomplished with a       regular forceps.      The examined jejunum was normal. Impression:            - Normal esophagus.                        - Roux-en-Y gastrojejunostomy with gastrojejunal                         anastomosis characterized by an intact staple line.                        - A staple was found in the stomach. Removal was                         successful.                        - Normal examined jejunum. Recommendation:        - Discharge patient to home.                        - Resume previous diet.                        - Continue present medications.                        - Perform a colonoscopy today. Procedure Code(s):     --- Professional ---                        [phone], Esophagogastroduodenoscopy, flexible,                         transoral; with removal of foreign body(s) Diagnosis Code(s):     --- Professional ---  R12, Heartburn                        R10.13, Epigastric pain                        T18.2XXA, Foreign body in stomach, initial encounter CPT copyright 2019 American Medical Association. All rights reserved. The codes documented in this report are preliminary and upon coder review may  be revised to meet current compliance requirements. [name] MD, MD 03/16/2021 8:12:14 AM This report has been signed electronically. Number of Addenda: 0 Note Initiated On: 03/16/2021 7:58 AM Total Procedure Duration: 0 hours 3 minutes 42 seconds  Estimated Blood Loss:   Estimated blood loss: none.      [facility]

## 2021-03-16 NOTE — Transfer of Care (Signed)
Immediate Anesthesia Transfer of Care Note  Patient: Lauren Phillips  Procedure(s) Performed: COLONOSCOPY WITH PROPOFOL (Rectum) ESOPHAGOGASTRODUODENOSCOPY (EGD) WITH PROPOFOL (Throat)  Patient Location: PACU  Anesthesia Type: General  Level of Consciousness: awake, alert  and patient cooperative  Airway and Oxygen Therapy: Patient Spontanous Breathing and Patient connected to supplemental oxygen  Post-op Assessment: Post-op Vital signs reviewed, Patient's Cardiovascular Status Stable, Respiratory Function Stable, Patent Airway and No signs of Nausea or vomiting  Post-op Vital Signs: Reviewed and stable  Complications: No notable events documented.

## 2021-03-16 NOTE — H&P (Signed)
[name], MD [facility] [address], [address] [place], [place] [address] Phone:[phone] Fax : [phone]  Primary Care Physician:  Leda Quail, [place] Primary Gastroenterologist:  Dr. Servando Snare  Pre-Procedure History & Physical: HPI:  JOSETTA Phillips is a 50 y.o. female is here for an endoscopy and colonoscopy.   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 Authorizing Provider  Ascorbic Acid (VITAMIN C ADULT GUMMIES PO) Take by mouth daily.   Yes [provider]  BREO ELLIPTA 100-25 MCG/INH AEPB 1 puff daily. 02/07/21  Yes [provider]  buPROPion (WELLBUTRIN SR) 150 MG 12 hr tablet Take 150 mg by mouth 2 (two) times daily. 01/25/21  Yes [provider]  cetirizine (ZYRTEC ALLERGY) 10 MG tablet Take 1 tablet (10 mg total) by mouth daily. 06/16/20  Yes Cook, Jayce G, DO  clonazePAM (KLONOPIN) 0.5 MG tablet Take 0.5 mg by mouth as needed for anxiety.   Yes [provider]  cyclobenzaprine (FLEXERIL) 5 MG tablet Take 5 mg by mouth 3 (three) times daily as needed. 01/25/21  Yes [provider]  FARXIGA 10 MG TABS tablet Take 10 mg by mouth daily. 03/30/20  Yes [provider]  ipratropium (ATROVENT) 0.06 % nasal spray Place 2 sprays into both nostrils 4 (four) times daily as needed for rhinitis. 06/16/20  Yes Cook, Jayce G, DO  levothyroxine (SYNTHROID, LEVOTHROID) 50 MCG tablet TK 1 T PO QD 04/21/18  Yes [provider]  meloxicam (MOBIC) 15 MG tablet Take 15 mg by mouth daily. 02/29/20  Yes [provider]  metFORMIN (GLUCOPHAGE) 1000 MG tablet Take 1,000 mg by mouth 2 (two) times daily with a meal.   Yes [provider]  omeprazole (PRILOSEC) 40 MG capsule Take 40 mg by mouth 2 (two) times daily. 02/28/20  Yes [provider]  sertraline (ZOLOFT) 100 MG tablet Take 100 mg by mouth daily.   Yes [provider]  Vitamin D, Ergocalciferol, (DRISDOL) 1.25 MG (50000 UNIT) CAPS capsule Take 50,000 Units by mouth once a week. 03/30/20  Yes [provider]  polyethylene glycol-electrolytes (GAVILYTE-N WITH FLAVOR PACK) 420 g solution Drink one 8 oz glass every 20 mins until entire container is finished starting at 5:00pm on 03/15/21 03/13/21   [name], MD    Allergies as of 03/05/2021   (No Known Allergies)    Family History  Problem Relation Age of Onset   Osteoarthritis Mother    Neuropathy Mother    Diabetes Mother        pre-diabetes   Melanoma Mother    Prostate cancer Father    Diabetes Father    Hypertension Father    Gout Father     Social History   Socioeconomic History   Marital status: Married    Spouse name: Not on file   Number of  children: Not on file   Years of education: Not on file   Highest education level: Not on file  Occupational History   Not on file  Tobacco Use   Smoking status: Never   Smokeless tobacco: Never  Vaping Use   Vaping Use: Never used  Substance and Sexual Activity   Alcohol use: Yes    Comment: rare   Drug use: Never   Sexual activity: Not on file  Other Topics Concern   Not on file  Social History Narrative   Not on file   Social Determinants of Health   Financial Resource Strain: Not on file  Food Insecurity: Not on file  Transportation Needs: Not on file  Physical Activity: Not on file  Stress: Not on file  Social Connections: Not on file  Intimate Partner Violence: Not on file    Review of  Systems: See HPI, otherwise negative ROS  Physical Exam: BP 102/64   Pulse 67   Temp (!) 97.5 F (36.4 C) (Temporal)   Ht 5\' 4"  (1.626 m)   Wt 82.6 kg   SpO2 97%   BMI 31.24 kg/m  General:   Alert,  pleasant and cooperative in NAD Head:  Normocephalic and atraumatic. Neck:  Supple; no masses or thyromegaly. Lungs:  Clear throughout to auscultation.    Heart:  Regular rate and rhythm. Abdomen:  Soft, nontender and nondistended. Normal bowel sounds, without guarding, and without rebound.   Neurologic:  Alert and  oriented x4;  grossly normal neurologically.  Impression/Plan: Lauren Phillips is here for an endoscopy and colonoscopy to be performed for epigastric pain and screening  Risks, benefits, limitations, and alternatives regarding  endoscopy and colonoscopy have been reviewed with the patient.  Questions have been answered.  All parties agreeable.   Pershing Proud, MD  03/16/2021, 7:30 AM

## 2021-03-19 ENCOUNTER — Encounter: Payer: Self-pay | Admitting: Gastroenterology

## 2021-04-08 ENCOUNTER — Encounter: Payer: Self-pay | Admitting: Emergency Medicine

## 2021-04-08 ENCOUNTER — Ambulatory Visit
Admission: EM | Admit: 2021-04-08 | Discharge: 2021-04-08 | Disposition: A | Discharge status: Home / Self Care | Payer: Managed Care, Other (non HMO) | Attending: Emergency Medicine | Admitting: Emergency Medicine

## 2021-04-08 ENCOUNTER — Ambulatory Visit (INDEPENDENT_AMBULATORY_CARE_PROVIDER_SITE_OTHER): Payer: Managed Care, Other (non HMO)

## 2021-04-08 DIAGNOSIS — W19XXXA Unspecified fall, initial encounter: Secondary | ICD-10-CM | POA: Diagnosis not present

## 2021-04-08 DIAGNOSIS — M545 Low back pain, unspecified: Secondary | ICD-10-CM

## 2021-04-08 DIAGNOSIS — M533 Sacrococcygeal disorders, not elsewhere classified: Secondary | ICD-10-CM

## 2021-04-08 MED ORDER — METHOCARBAMOL 500 MG PO TABS: 500.0000 mg | ORAL_TABLET | Freq: Two times a day (BID) | ORAL | 0 refills | Status: AC | PRN

## 2021-04-08 NOTE — ED Triage Notes (Signed)
Pt c/o fall yesterday evening outside of the pool, she landed on the ground which was gravel. She states she slipped and landed on her lower back/tailbone. She has "strange sensations" in her feet. She took a muscle relaxer (flexeril) and tylenol.

## 2021-04-08 NOTE — Discharge Instructions (Addendum)
Take the muscle relaxer as needed for muscle spasm; Do not drive, operate machinery, or drink alcohol with this medication as it can cause drowsiness.   Follow up with your primary care provider or an orthopedist if your symptoms are not improving.     

## 2021-04-08 NOTE — ED Provider Notes (Signed)
[facility]    CSN: [id number] Arrival date & time: 04/08/21  4098      History   Chief Complaint Chief Complaint  Patient presents with   Tailbone Pain   Fall    HPI Lauren Phillips is a 50 y.o. female.  Patient presents with pain in her lower back and tailbone after falling yesterday.  She states she tripped and fell directly on her tailbone.  No loss of consciousness.  She reports paresthesias in both feet.  She denies numbness, weakness, saddle anesthesia, loss of bowel/bladder control, abdominal pain, hematuria, fever, wounds, rash, or other symptoms.  Treatment attempted at home with Flexeril and Tylenol.  Her medical history includes osteoarthritis, diabetes, hypothyroidism, gout, GERD.  The history is provided by the patient and medical records.   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   Diabetes mellitus without complication (HCC)    GERD (gastroesophageal reflux disease)    Gout    Hypothyroidism    Motion sickness    small ocean boats    Patient Active Problem List   Diagnosis Date Noted   Abdominal pain, epigastric    Gastroesophageal reflux disease without esophagitis    Screen for colon cancer    Iron deficiency anemia 02/17/2017   Carpal tunnel syndrome, left 02/17/2015   OA (osteoarthritis) of knee 04/12/2013   Bursitis, shoulder 11/27/2012   Sprain of shoulder, right 11/27/2012    Past Surgical History:  Procedure Laterality Date   ABDOMINAL HYSTERECTOMY     BARIATRIC SURGERY     CARPAL TUNNEL RELEASE Bilateral    CATARACT EXTRACTION, BILATERAL     COLONOSCOPY WITH PROPOFOL N/A 03/16/2021   Procedure: COLONOSCOPY WITH PROPOFOL;  Surgeon: [name], MD;  Location: [facility];  Service: Endoscopy;  Laterality: N/A;   ESOPHAGOGASTRODUODENOSCOPY (EGD) WITH PROPOFOL N/A 03/16/2021   Procedure: ESOPHAGOGASTRODUODENOSCOPY (EGD) WITH PROPOFOL;   Surgeon: [name], MD;  Location: [facility];  Service: Endoscopy;  Laterality: N/A;  Diabetic - oral meds   TONSILLECTOMY      OB History   No obstetric history on file.      Home Medications    Prior to Admission medications   Medication Sig Start Date End Date Taking? Authorizing Provider  methocarbamol (ROBAXIN) 500 MG tablet Take 1 tablet (500 mg total) by mouth 2 (two) times daily as needed for muscle spasms. 04/08/21  Yes Mickie Bail, NP  Ascorbic Acid (VITAMIN C ADULT GUMMIES PO) Take by mouth daily.    [provider]  BREO ELLIPTA 100-25 MCG/INH AEPB 1 puff daily. 02/07/21   [provider]  buPROPion (WELLBUTRIN SR) 150 MG 12 hr tablet Take 150 mg by mouth 2 (two) times daily. 01/25/21   [provider]  cetirizine (ZYRTEC ALLERGY) 10 MG tablet Take 1 tablet (10 mg total) by mouth daily. 06/16/20   Tommie Sams, DO  clonazePAM (KLONOPIN) 0.5 MG tablet Take 0.5 mg by mouth as needed for anxiety.    [provider]  cyclobenzaprine (FLEXERIL) 5 MG tablet Take 5 mg by mouth 3 (three) times daily as needed. 01/25/21   [provider]  FARXIGA 10 MG TABS tablet Take 10 mg by mouth daily. 03/30/20   [provider]  ipratropium (ATROVENT) 0.06 % nasal spray Place 2 sprays into both nostrils 4 (four) times daily as needed  for rhinitis. 06/16/20   Tommie Samsook, Jayce G, DO  levothyroxine (SYNTHROID, LEVOTHROID) 50 MCG tablet TK 1 T PO QD 04/21/18   [provider]  meloxicam (MOBIC) 15 MG tablet Take 15 mg by mouth daily. 02/29/20   [provider]  metFORMIN (GLUCOPHAGE) 1000 MG tablet Take 1,000 mg by mouth 2 (two) times daily with a meal.    [provider]  omeprazole (PRILOSEC) 40 MG capsule Take 40 mg by mouth 2 (two) times daily. 02/28/20   [provider]  polyethylene glycol-electrolytes (GAVILYTE-N WITH FLAVOR PACK) 420 g solution Drink one 8 oz glass every 20 mins until entire container is  finished starting at 5:00pm on 03/15/21 03/13/21   Midge MiniumWohl, Darren, MD  sertraline (ZOLOFT) 100 MG tablet Take 100 mg by mouth daily.    [provider]  Vitamin D, Ergocalciferol, (DRISDOL) 1.25 MG (50000 UNIT) CAPS capsule Take 50,000 Units by mouth once a week. 03/30/20   [provider]    Family History Family History  Problem Relation Age of Onset   Osteoarthritis Mother    Neuropathy Mother    Diabetes Mother        pre-diabetes   Melanoma Mother    Prostate cancer Father    Diabetes Father    Hypertension Father    Gout Father     Social History Social History   Tobacco Use   Smoking status: Never   Smokeless tobacco: Never  Vaping Use   Vaping Use: Never used  Substance Use Topics   Alcohol use: Yes    Comment: rare   Drug use: Never     Allergies   Hydrocodone   Review of Systems Review of Systems  Constitutional:  Negative for chills and fever.  Respiratory:  Negative for cough and shortness of breath.   Cardiovascular:  Negative for chest pain and palpitations.  Gastrointestinal:  Negative for abdominal pain and vomiting.  Genitourinary:  Negative for dysuria and hematuria.  Musculoskeletal:  Positive for back pain. Negative for arthralgias and neck pain.  Skin:  Negative for color change, rash and wound.  Neurological:  Negative for syncope, weakness and numbness.  All other systems reviewed and are negative.   Physical Exam Triage Vital Signs ED Triage Vitals  Enc Vitals Group     BP      Pulse      Resp      Temp      Temp src      SpO2      Weight      Height      Head Circumference      Peak Flow      Pain Score      Pain Loc      Pain Edu?      Excl. in GC?    No data found.  Updated Vital Signs BP 111/76 (BP Location: Left Arm)   Pulse 83   Temp 97.9 F (36.6 C) (Oral)   Resp 18   SpO2 99%   Visual Acuity Right Eye Distance:   Left Eye Distance:   Bilateral Distance:    Right Eye Near:   Left Eye Near:     Bilateral Near:     Physical Exam Vitals and nursing note reviewed.  Constitutional:      General: She is not in acute distress.    Appearance: She is well-developed. She is not ill-appearing.  HENT:     Head: Normocephalic and atraumatic.  Mouth/Throat:     Mouth: Mucous membranes are moist.  Eyes:     Conjunctiva/sclera: Conjunctivae normal.  Cardiovascular:     Rate and Rhythm: Normal rate and regular rhythm.     Heart sounds: Normal heart sounds.  Pulmonary:     Effort: Pulmonary effort is normal. No respiratory distress.     Breath sounds: Normal breath sounds.  Abdominal:     General: Bowel sounds are normal.     Palpations: Abdomen is soft.     Tenderness: There is no abdominal tenderness. There is no guarding or rebound.  Musculoskeletal:        General: No swelling, tenderness, deformity or signs of injury.     Cervical back: Neck supple.  Skin:    General: Skin is warm and dry.     Capillary Refill: Capillary refill takes less than 2 seconds.     Findings: No bruising, erythema, lesion or rash.  Neurological:     General: No focal deficit present.     Mental Status: She is alert and oriented to person, place, and time.     Sensory: No sensory deficit.     Motor: No weakness.     Gait: Gait normal.  Psychiatric:        Mood and Affect: Mood normal.        Behavior: Behavior normal.     UC Treatments / Results  Labs (all labs ordered are listed, but only abnormal results are displayed) Labs Reviewed - No data to display  EKG   Radiology DG Lumbar Spine Complete  Result Date: 04/08/2021 CLINICAL DATA:  Pain after fall EXAM: LUMBAR SPINE - COMPLETE 4+ VIEW COMPARISON:  None. FINDINGS: No fracture or traumatic malalignment. Multilevel degenerative disc disease and lower lumbar facet degenerative changes noted. IMPRESSION: Degenerative changes as above. No evidence of fracture or traumatic malalignment. Electronically Signed   By: Gerome Sam III  M.D   On: 04/08/2021 10:40   DG Sacrum/Coccyx  Result Date: 04/08/2021 CLINICAL DATA:  Pain after fall EXAM: SACRUM AND COCCYX - 2+ VIEW COMPARISON:  None. FINDINGS: There is no evidence of fracture or other focal bone lesions. IMPRESSION: Negative. Electronically Signed   By: Gerome Sam III M.D   On: 04/08/2021 10:39    Procedures Procedures (including critical care time)  Medications Ordered in UC Medications - No data to display  Initial Impression / Assessment and Plan / UC Course  I have reviewed the triage vital signs and the nursing notes.  Pertinent labs & imaging results that were available during my care of the patient were reviewed by me and considered in my medical decision making (see chart for details).  Acute low back pain and sacral back pain.  X-rays negative.  Treating with methocarbamol.  Precautions for drowsiness with this medication discussed.  Also discussed with patient not to combine the methocarbamol with her already prescribed cyclobenzaprine.  Instructed her to continue Tylenol as needed for discomfort.  Discussed follow-up with her PCP or an orthopedist if her symptoms are not improving.  She agrees to plan of care.   Final Clinical Impressions(s) / UC Diagnoses   Final diagnoses:  Acute bilateral low back pain without sciatica  Sacral back pain     Discharge Instructions      Take the muscle relaxer as needed for muscle spasm; Do not drive, operate machinery, or drink alcohol with this medication as it can cause drowsiness.   Follow up with your primary care  provider or an orthopedist if your symptoms are not improving.         ED Prescriptions     Medication Sig Dispense Auth. Provider   methocarbamol (ROBAXIN) 500 MG tablet Take 1 tablet (500 mg total) by mouth 2 (two) times daily as needed for muscle spasms. 10 tablet Mickie Bail, NP      I have reviewed the PDMP during this encounter.   Mickie Bail, NP 04/08/21 1053

## 2021-09-26 ENCOUNTER — Ambulatory Visit
Admission: EM | Admit: 2021-09-26 | Discharge: 2021-09-26 | Disposition: A | Discharge status: Home / Self Care | Payer: Managed Care, Other (non HMO) | Attending: Emergency Medicine | Admitting: Emergency Medicine

## 2021-09-26 ENCOUNTER — Other Ambulatory Visit: Payer: Self-pay

## 2021-09-26 DIAGNOSIS — Z20822 Contact with and (suspected) exposure to covid-19: Secondary | ICD-10-CM | POA: Diagnosis not present

## 2021-09-26 DIAGNOSIS — B349 Viral infection, unspecified: Secondary | ICD-10-CM | POA: Diagnosis not present

## 2021-09-26 LAB — URINALYSIS, COMPLETE (UACMP) WITH MICROSCOPIC
Bilirubin Urine: NEGATIVE
Glucose, UA: 500 mg/dL — AB
Hgb urine dipstick: NEGATIVE
Leukocytes,Ua: NEGATIVE
Nitrite: NEGATIVE
Protein, ur: NEGATIVE mg/dL
Specific Gravity, Urine: 1.01 (ref 1.005–1.030)
pH: 5.5 (ref 5.0–8.0)

## 2021-09-26 LAB — RESP PANEL BY RT-PCR (FLU A&B, COVID) ARPGX2
Influenza A by PCR: NEGATIVE
Influenza B by PCR: NEGATIVE
SARS Coronavirus 2 by RT PCR: NEGATIVE

## 2021-09-26 IMAGING — CR DG LUMBAR SPINE COMPLETE 4+V
5 series · 5 of 5 positions shown · non-contrast
Comparison: None.

CLINICAL DATA: Pain after fall

EXAM:
LUMBAR SPINE - COMPLETE 4+ VIEW

[l-spine obl (1 of 2)]
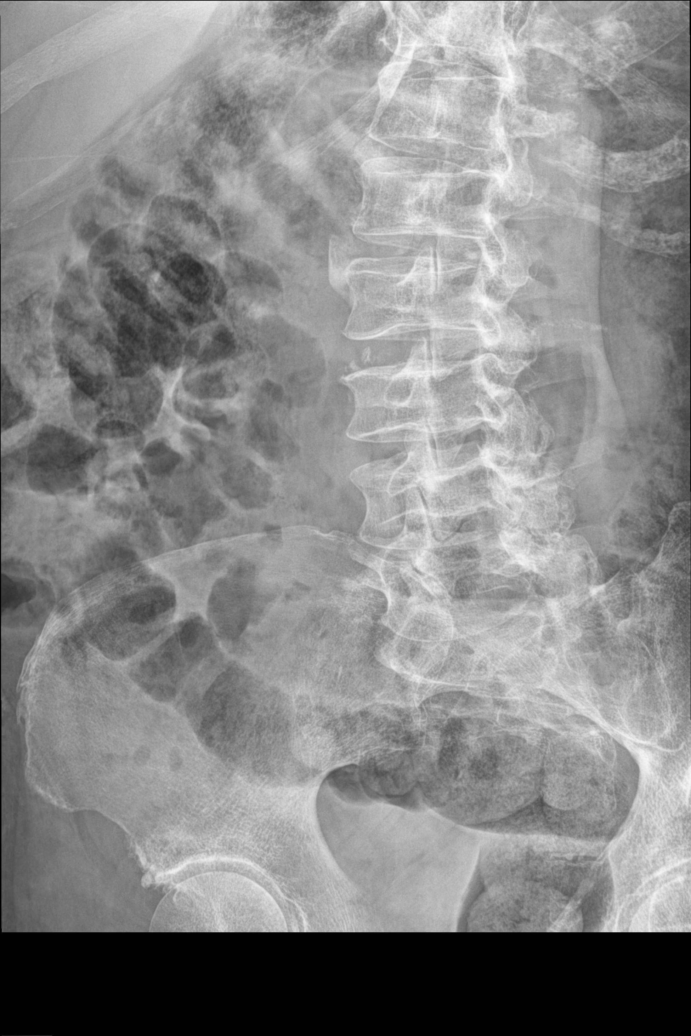

[l-spine obl (2 of 2)]
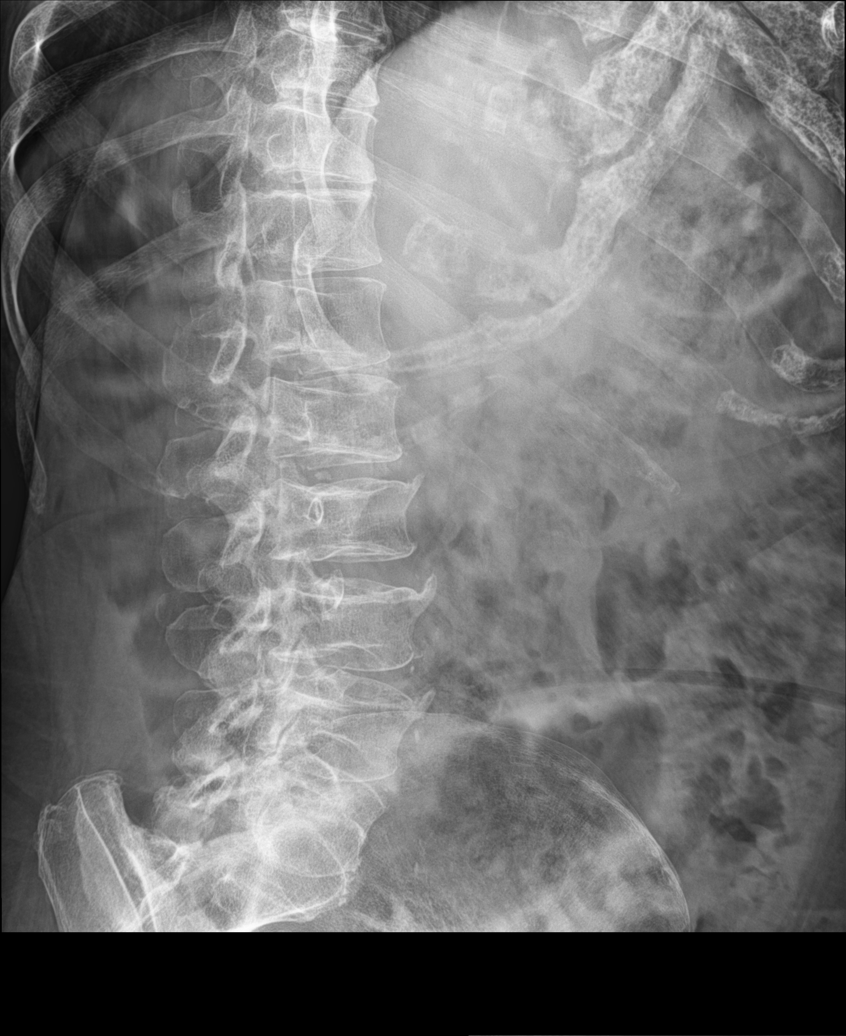

[l-spine lat]
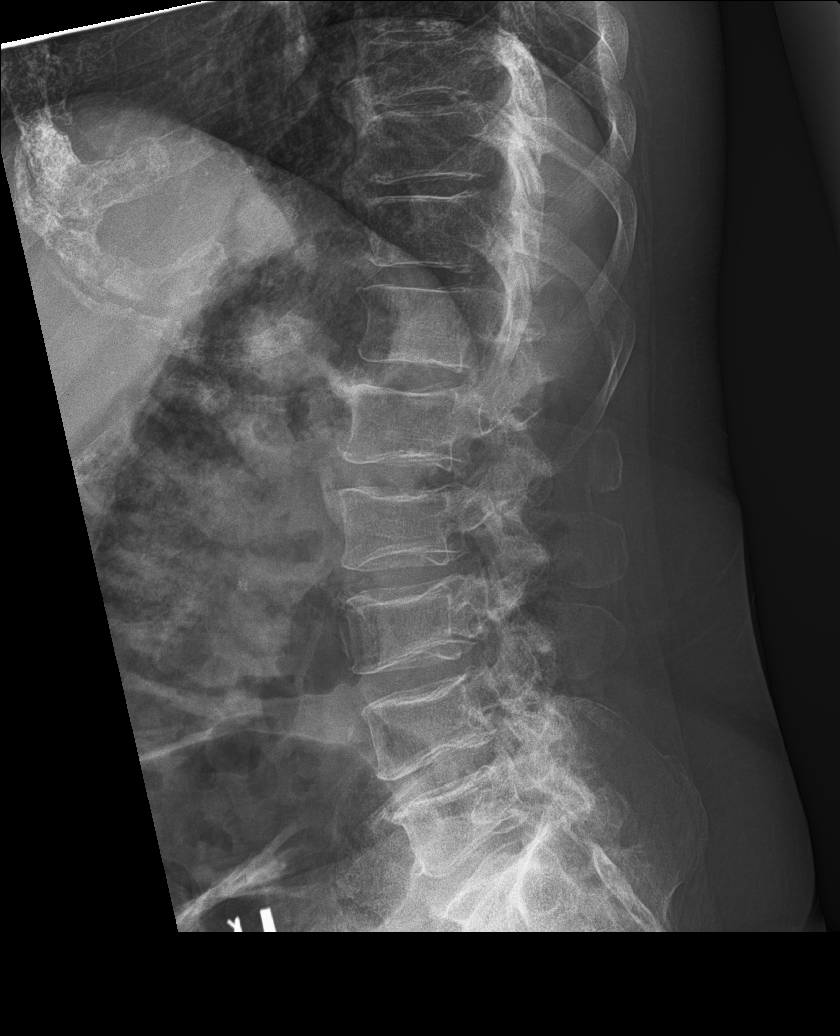

[l-spine spot]
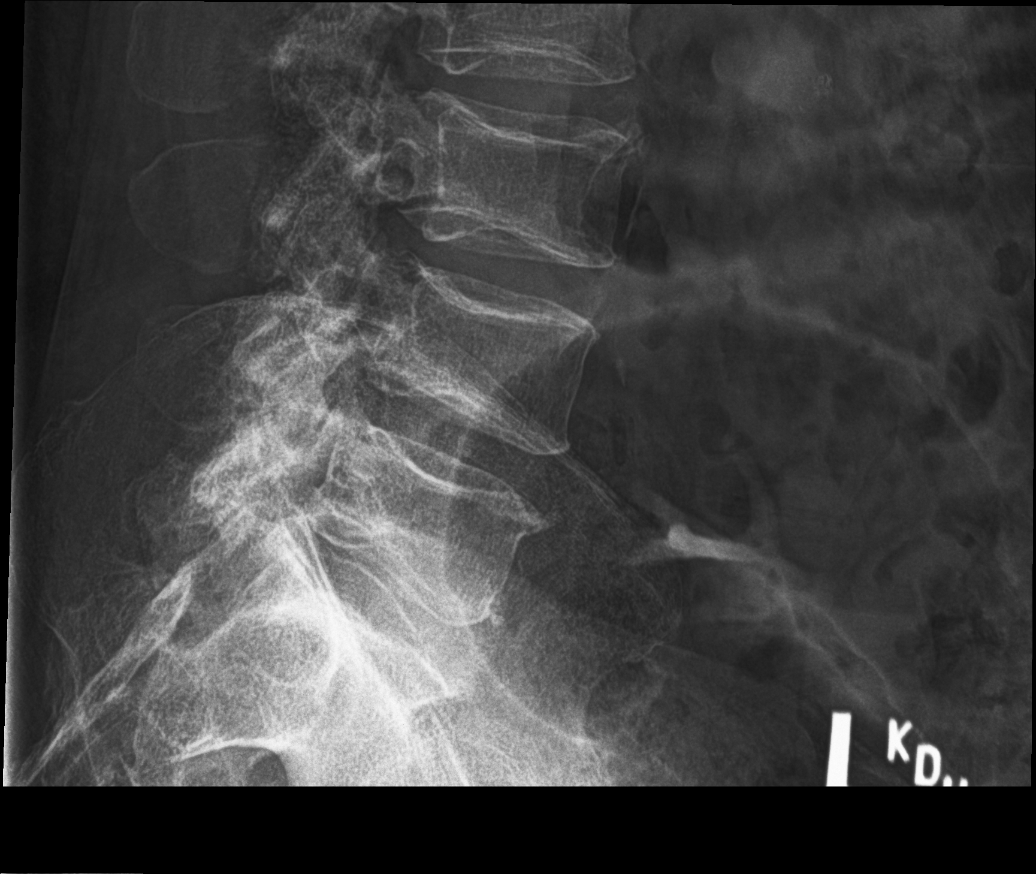

[l-spine ap]
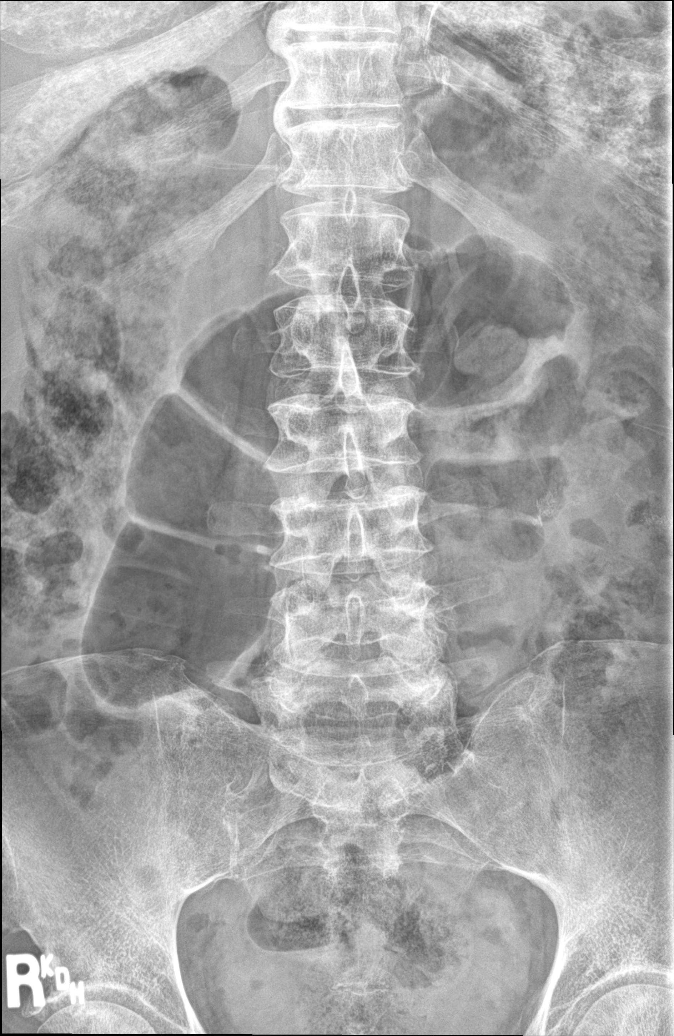

[5 of 5 positions shown; findings below may reference images not displayed]

FINDINGS: No fracture or traumatic malalignment. Multilevel degenerative disc
disease and lower lumbar facet degenerative changes noted.
IMPRESSION: Degenerative changes as above. No evidence of fracture or traumatic
malalignment.

## 2021-09-26 NOTE — Discharge Instructions (Addendum)
Your urinalysis and respiratory testing today did not yield any signs of infection.  Go home and rest.  Use over-the-counter Tylenol and ibuprofen according to the package instructions as needed for fever or body aches.  Increase oral fluid intake so that she would not increase the hydration available to your body and flush your system.  Return for reevaluation, or see your PCP, for any new or worsening symptoms.

## 2021-09-26 NOTE — ED Triage Notes (Signed)
Patient is here for "Fever, Body aches". Started yesterday with "Fatigue, Body aches, Fever up to 100.2 @ first". OTC medication helped. Last night Fever up to 102.6. Woke up this am, Still with Fever, Body aches "more intense". Feels like "when I had kidney stones". Increased PO's at night "water" (thirsty). No dysuria. No flank pain.

## 2021-09-26 NOTE — ED Provider Notes (Signed)
[facility]    CSN: [id number] Arrival date & time: 09/26/21  [id number]      History   Chief Complaint Chief Complaint  Patient presents with   Fever   Generalized Body Aches    HPI Lauren Phillips is a 51 y.o. female.   HPI  51 year old female here for evaluation of flulike symptoms.  Patient reports that yesterday she developed fatigue body aches and a fever with a T-max of 100.2.  She use over-the-counter medication with improvement of symptoms but last night her fever spiked at 102.6.  When she woke up this morning she saw the fever, more intense body aches, and nausea.  She states that her most intense body ache is in her lower back and it feels like when she had a kidney stone.  She states that she did increase her oral fluid intake of water because she was thirsty last night.  She denies runny nose, nasal congestion, ear pain, sore throat, cough, shortness breath, wheezing, vomiting, diarrhea, abdominal pain, painful urination, urinary urgency, or urinary frequency.  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   Diabetes mellitus without complication (HCC)    GERD (gastroesophageal reflux disease)    Gout    Hypothyroidism    Motion sickness    small ocean boats    Patient Active Problem List   Diagnosis Date Noted   Abdominal pain, epigastric    Gastroesophageal reflux disease without esophagitis    Screen for colon cancer    Iron deficiency anemia 02/17/2017   Carpal tunnel syndrome, left 02/17/2015   OA (osteoarthritis) of knee 04/12/2013   Bursitis, shoulder 11/27/2012   Sprain of shoulder, right 11/27/2012    Past Surgical History:  Procedure Laterality Date   ABDOMINAL HYSTERECTOMY     BARIATRIC SURGERY     CARPAL TUNNEL RELEASE Bilateral    CATARACT EXTRACTION, BILATERAL     COLONOSCOPY WITH PROPOFOL N/A 03/16/2021   Procedure: COLONOSCOPY WITH PROPOFOL;   Surgeon: Lucilla Lame, MD;  Location: [place];  Service: Endoscopy;  Laterality: N/A;   ESOPHAGOGASTRODUODENOSCOPY (EGD) WITH PROPOFOL N/A 03/16/2021   Procedure: ESOPHAGOGASTRODUODENOSCOPY (EGD) WITH PROPOFOL;  Surgeon: Lucilla Lame, MD;  Location: [place];  Service: Endoscopy;  Laterality: N/A;  Diabetic - oral meds   TONSILLECTOMY      OB History   No obstetric history on file.      Home Medications    Prior to Admission medications   Medication Sig Start Date End Date Taking? Authorizing Provider  acetaminophen (TYLENOL) 500 MG tablet Take 500 mg by mouth every 6 (six) hours as needed.   Yes [provider]  Ascorbic Acid (VITAMIN C ADULT GUMMIES PO) Take by mouth daily.   Yes [provider]  buPROPion (WELLBUTRIN SR) 150 MG 12 hr tablet Take 150 mg by mouth 2 (two) times daily. 01/25/21  Yes [provider]  cetirizine (ZYRTEC ALLERGY) 10 MG tablet Take 1 tablet (10 mg total) by mouth daily. 06/16/20  Yes Cook, Jayce G, DO  FARXIGA 10 MG TABS tablet Take 10 mg by mouth daily. 03/30/20  Yes [provider]  Ferrous Sulfate (IRON PO) Take by mouth.   Yes [provider]  ibuprofen (ADVIL) 800 MG tablet Take 800 mg by mouth every 8 (eight) hours as needed.   Yes [provider]  levothyroxine (  SYNTHROID, LEVOTHROID) 50 MCG tablet TK 1 T PO QD 04/21/18  Yes [provider]  meloxicam (MOBIC) 15 MG tablet Take 15 mg by mouth daily. 02/29/20  Yes [provider]  metFORMIN (GLUCOPHAGE) 1000 MG tablet Take 1,000 mg by mouth 2 (two) times daily with a meal.   Yes [provider]  omeprazole (PRILOSEC) 40 MG capsule Take 40 mg by mouth 2 (two) times daily. 02/28/20  Yes [provider]  sertraline (ZOLOFT) 100 MG tablet Take 100 mg by mouth daily.   Yes [provider]  Vitamin D, Ergocalciferol, (DRISDOL) 1.25 MG (50000 UNIT) CAPS capsule Take 50,000 Units by mouth once a week.  03/30/20  Yes [provider]  BREO ELLIPTA 100-25 MCG/INH AEPB 1 puff daily. 02/07/21   [provider]  clonazePAM (KLONOPIN) 0.5 MG tablet Take 0.5 mg by mouth as needed for anxiety.    [provider]  cyclobenzaprine (FLEXERIL) 5 MG tablet Take 5 mg by mouth 3 (three) times daily as needed. 01/25/21   [provider]  ipratropium (ATROVENT) 0.06 % nasal spray Place 2 sprays into both nostrils 4 (four) times daily as needed for rhinitis. 06/16/20   Coral Spikes, DO  methocarbamol (ROBAXIN) 500 MG tablet Take 1 tablet (500 mg total) by mouth 2 (two) times daily as needed for muscle spasms. 04/08/21   Sharion Balloon, NP  polyethylene glycol-electrolytes (GAVILYTE-N WITH FLAVOR PACK) 420 g solution Drink one 8 oz glass every 20 mins until entire container is finished starting at 5:00pm on 03/15/21 03/13/21   Lucilla Lame, MD    Family History Family History  Problem Relation Age of Onset   Osteoarthritis Mother    Neuropathy Mother    Diabetes Mother        pre-diabetes   Melanoma Mother    Prostate cancer Father    Diabetes Father    Hypertension Father    Gout Father     Social History Social History   Tobacco Use   Smoking status: Never   Smokeless tobacco: Never  Vaping Use   Vaping Use: Never used  Substance Use Topics   Alcohol use: Yes    Comment: rare   Drug use: Never     Allergies   Hydrocodone   Review of Systems Review of Systems  Constitutional:  Positive for fatigue and fever.  HENT:  Negative for congestion, ear pain, rhinorrhea and sore throat.   Respiratory:  Negative for cough, shortness of breath and wheezing.   Gastrointestinal:  Positive for nausea. Negative for abdominal pain, diarrhea and vomiting.  Genitourinary:  Negative for dysuria, frequency, hematuria and urgency.  Musculoskeletal:  Positive for arthralgias, back pain and myalgias.  Skin:  Negative for rash.  Hematological: Negative.   Psychiatric/Behavioral:  Negative.      Physical Exam Triage Vital Signs ED Triage Vitals  Enc Vitals Group     BP 09/26/21 0912 108/76     Pulse Rate 09/26/21 0912 (!) 112     Resp 09/26/21 0912 18     Temp 09/26/21 0912 (!) 100.7 F (38.2 C)     Temp Source 09/26/21 0912 Oral     SpO2 09/26/21 0912 98 %     Weight 09/26/21 0912 170 lb (77.1 kg)     Height 09/26/21 0912 5\' 4"  (1.626 m)     Head Circumference --      Peak Flow --      Pain Score 09/26/21 0911 8  Pain Loc --      Pain Edu? --      Excl. in [place]? --    No data found.  Updated Vital Signs BP 108/76 (BP Location: Left Arm)    Pulse (!) 112    Temp (!) 100.7 F (38.2 C) (Oral)    Resp 18    Ht 5\' 4"  (1.626 m)    Wt 170 lb (77.1 kg)    SpO2 98%    BMI 29.18 kg/m   Visual Acuity Right Eye Distance:   Left Eye Distance:   Bilateral Distance:    Right Eye Near:   Left Eye Near:    Bilateral Near:     Physical Exam Vitals and nursing note reviewed.  Constitutional:      General: She is not in acute distress.    Appearance: Normal appearance. She is ill-appearing.  HENT:     Head: Normocephalic and atraumatic.     Right Ear: Tympanic membrane, ear canal and external ear normal. There is no impacted cerumen.     Left Ear: Tympanic membrane, ear canal and external ear normal. There is no impacted cerumen.     Nose: Nose normal. No congestion or rhinorrhea.     Mouth/Throat:     Mouth: Mucous membranes are moist.     Pharynx: Oropharynx is clear. No posterior oropharyngeal erythema.  Cardiovascular:     Rate and Rhythm: Normal rate and regular rhythm.     Pulses: Normal pulses.     Heart sounds: Normal heart sounds. No murmur heard.   No friction rub. No gallop.  Pulmonary:     Effort: Pulmonary effort is normal.     Breath sounds: Normal breath sounds. No wheezing, rhonchi or rales.  Abdominal:     Tenderness: There is no right CVA tenderness or left CVA tenderness.  Musculoskeletal:     Cervical back: Normal range of  motion and neck supple.  Lymphadenopathy:     Cervical: No cervical adenopathy.  Skin:    General: Skin is warm and dry.     Capillary Refill: Capillary refill takes less than 2 seconds.     Findings: No erythema or rash.  Neurological:     General: No focal deficit present.     Mental Status: She is alert and oriented to person, place, and time.  Psychiatric:        Mood and Affect: Mood normal.        Behavior: Behavior normal.        Thought Content: Thought content normal.        Judgment: Judgment normal.     UC Treatments / Results  Labs (all labs ordered are listed, but only abnormal results are displayed) Labs Reviewed  URINALYSIS, COMPLETE (UACMP) WITH MICROSCOPIC - Abnormal; Notable for the following components:      Result Value   Glucose, UA 500 (*)    Ketones, ur TRACE (*)    Bacteria, UA FEW (*)    All other components within normal limits  RESP PANEL BY RT-PCR (FLU A&B, COVID) [id number]    EKG   Radiology No results found.  Procedures Procedures (including critical care time)  Medications Ordered in UC Medications - No data to display  Initial Impression / Assessment and Plan / UC Course  I have reviewed the triage vital signs and the nursing notes.  Pertinent labs & imaging results that were available during my care of the patient were reviewed by me and  considered in my medical decision making (see chart for details).  Patient is a pleasant, though ill-appearing, 51 year old female here for evaluation of fever, body aches, and nausea that all started yesterday.  She has a past medical history significant for reflux disease and iron deficiency anemia.  On physical exam she has pearly-gray tympanic membranes bilaterally with normal light reflex and clear external auditory canals.  Nasal mucosa is pink and moist without erythema, edema, or discharge.  Oropharyngeal exam is benign.  No cervical lymphadenopathy appreciated on exam.  Cardiopulmonary exam  reveals clear lung sounds in all fields.  No CVA tenderness on exam.  Patient reports that her back pain is further down on her left side near her iliac crest.  No overt spasm or tension noted in this area with palpation.  She mentioned in triage that the pain in her back feels similar to previous kidney stone but she has not seen any blood in her urine and has not had painful urination, urinary urgency, or urinary frequency.  I am suspicious patient may have influenza or COVID so I will swab her for both as well as send a urinalysis to look for any signs of infection.  Urinalysis shows 500 glucose, trace ketones, and few bacteria.  No hemoglobin, nitrites, protein, leukocyte Estrace.  Patient is on [place].  Respiratory triplex panel is negative for COVID and influenza.  I discussed these findings with the patient and I told her that I believe she has a viral infection at present.  She may have new symptoms that are yet to clear the cells.  I offered to draw blood to look for any signs of infection and she states that she would rather go home at this point and return for reevaluation, or see her PCP, for any new or worsening symptoms.  I discussed that she should rest, use Tylenol and ibuprofen as needed for fever and pain, and push fluids.  Patient verbalized understanding of same.  She states that she will get her discharge instructions from her MyChart.  Final Clinical Impressions(s) / UC Diagnoses   Final diagnoses:  Viral illness     Discharge Instructions      Your urinalysis and respiratory testing today did not yield any signs of infection.  Go home and rest.  Use over-the-counter Tylenol and ibuprofen according to the package instructions as needed for fever or body aches.  Increase oral fluid intake so that she would not increase the hydration available to your body and flush your system.  Return for reevaluation, or see your PCP, for any new or worsening symptoms.     ED  Prescriptions   None    PDMP not reviewed this encounter.   Margarette Canada, NP 09/26/21 1201

## 2021-12-20 ENCOUNTER — Encounter: Payer: Self-pay | Admitting: Emergency Medicine

## 2021-12-20 ENCOUNTER — Ambulatory Visit
Admission: EM | Admit: 2021-12-20 | Discharge: 2021-12-20 | Disposition: A | Discharge status: Home / Self Care | Payer: Managed Care, Other (non HMO) | Attending: Student | Admitting: Student

## 2021-12-20 DIAGNOSIS — H1013 Acute atopic conjunctivitis, bilateral: Secondary | ICD-10-CM | POA: Insufficient documentation

## 2021-12-20 DIAGNOSIS — J301 Allergic rhinitis due to pollen: Secondary | ICD-10-CM | POA: Diagnosis not present

## 2021-12-20 DIAGNOSIS — J4541 Moderate persistent asthma with (acute) exacerbation: Secondary | ICD-10-CM | POA: Insufficient documentation

## 2021-12-20 LAB — POCT RAPID STREP A (OFFICE): Rapid Strep A Screen: NEGATIVE

## 2021-12-20 MED ORDER — OLOPATADINE HCL 0.1 % OP SOLN: 1.0000 [drp] | Freq: Two times a day (BID) | OPHTHALMIC | 2 refills | Status: AC

## 2021-12-20 MED ORDER — PREDNISONE 20 MG PO TABS: 40.0000 mg | ORAL_TABLET | Freq: Every day | ORAL | 0 refills | Status: AC

## 2021-12-20 NOTE — ED Provider Notes (Signed)
[facility]    CSN: [id number] Arrival date & time: 12/20/21  5284      History   Chief Complaint Chief Complaint  Patient presents with   Cough   Nasal Congestion   Eye Irritation   Sore Throat    HPI Lauren Phillips is a 51 y.o. female presenting with cough, congestion, sore throat for 3 weeks.  History diabetes.  She takes daily Breo Ellipta inhaler, but is unsure why she uses this and denies history of pulmonary disease.  Also denies history of allergies.  Describes cough that is generally nonproductive, but occasionally productive of yellow sputum; nasal congestion; postnasal drip; sore throat.  Also with eye irritation for few days, describes this as itching, watering, crusting in the morning.  Denies sore throat, chest pain, fever/chills, dizziness, weakness.  Denies vision changes, blurred vision, vision loss, new floaters or flashes of light in field of vision, eye pain, eye pain with movement, foreign body sensation, photophobia.  Has attempted Mucinex with minimal relief. Doesn't wear contacts.   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   Diabetes mellitus without complication (HCC)    GERD (gastroesophageal reflux disease)    Gout    Hypothyroidism    Motion sickness    small ocean boats    Patient Active Problem List   Diagnosis Date Noted   Abdominal pain, epigastric    Gastroesophageal reflux disease without esophagitis    Screen for colon cancer    Iron deficiency anemia 02/17/2017   Carpal tunnel syndrome, left 02/17/2015   OA (osteoarthritis) of knee 04/12/2013   Bursitis, shoulder 11/27/2012   Sprain of shoulder, right 11/27/2012    Past Surgical History:  Procedure Laterality Date   ABDOMINAL HYSTERECTOMY     BARIATRIC SURGERY     CARPAL TUNNEL RELEASE Bilateral    CATARACT EXTRACTION, BILATERAL     COLONOSCOPY WITH PROPOFOL N/A 03/16/2021    Procedure: COLONOSCOPY WITH PROPOFOL;  Surgeon: [name], MD;  Location: [facility];  Service: Endoscopy;  Laterality: N/A;   ESOPHAGOGASTRODUODENOSCOPY (EGD) WITH PROPOFOL N/A 03/16/2021   Procedure: ESOPHAGOGASTRODUODENOSCOPY (EGD) WITH PROPOFOL;  Surgeon: [name], MD;  Location: [facility];  Service: Endoscopy;  Laterality: N/A;  Diabetic - oral meds   TONSILLECTOMY      OB History   No obstetric history on file.      Home Medications    Prior to Admission medications   Medication Sig Start Date End Date Taking? Authorizing Provider  allopurinol (ZYLOPRIM) 300 MG tablet Take by mouth. 10/01/05  Yes [provider]  Ascorbic Acid (VITAMIN C ADULT GUMMIES PO) Take by mouth daily.   Yes [provider]  BREO ELLIPTA 100-25 MCG/INH AEPB 1 puff daily. 02/07/21  Yes [provider]  buPROPion (WELLBUTRIN SR) 150 MG 12 hr tablet Take 150 mg by mouth 2 (two) times daily. 01/25/21  Yes [provider]  cetirizine (ZYRTEC ALLERGY) 10 MG tablet Take 1 tablet (10 mg total) by mouth daily. 06/16/20  Yes Cook, Jayce G, DO  clonazePAM (KLONOPIN) 0.5 MG tablet Take 0.5 mg by mouth as needed for anxiety.   Yes [provider]  FARXIGA 10 MG TABS tablet Take 10 mg by mouth daily. 03/30/20  Yes [provider]  levothyroxine (SYNTHROID, LEVOTHROID) 50 MCG tablet TK 1 T PO QD 04/21/18  Yes [provider]  olopatadine (PATANOL) 0.1 % ophthalmic solution Place 1 drop into both eyes 2 (two) times daily. 12/20/21  Yes Rhys Martini, PA-C  omeprazole (PRILOSEC) 40 MG capsule Take 40 mg by mouth 2 (two) times daily. 02/28/20  Yes [provider]  predniSONE (DELTASONE) 20 MG tablet Take 2 tablets (40 mg total) by mouth daily for 5 days. Take with breakfast or lunch. Avoid NSAIDs (ibuprofen, etc) while taking this medication. 12/20/21 12/25/21 Yes Rhys Martini, PA-C  sertraline (ZOLOFT) 100 MG tablet Take 100 mg by mouth  daily.   Yes [provider]  acetaminophen (TYLENOL) 500 MG tablet Take 500 mg by mouth every 6 (six) hours as needed.    [provider]  cyclobenzaprine (FLEXERIL) 5 MG tablet Take 5 mg by mouth 3 (three) times daily as needed. 01/25/21   [provider]  Estradiol 10 MCG TABS vaginal tablet Place 1 tablet vaginally 2 (two) times a week. 07/31/21   [provider]  Ferrous Sulfate (IRON PO) Take by mouth.    [provider]  ibuprofen (ADVIL) 800 MG tablet Take 800 mg by mouth every 8 (eight) hours as needed.    [provider]  ipratropium (ATROVENT) 0.06 % nasal spray Place 2 sprays into both nostrils 4 (four) times daily as needed for rhinitis. 06/16/20   Tommie Sams, DO  meloxicam (MOBIC) 15 MG tablet Take 15 mg by mouth daily. 02/29/20   [provider]  metFORMIN (GLUCOPHAGE) 1000 MG tablet Take by mouth.    [provider]  methocarbamol (ROBAXIN) 500 MG tablet Take 1 tablet (500 mg total) by mouth 2 (two) times daily as needed for muscle spasms. 04/08/21   Mickie Bail, NP  polyethylene glycol-electrolytes (GAVILYTE-N WITH FLAVOR PACK) 420 g solution Drink one 8 oz glass every 20 mins until entire container is finished starting at 5:00pm on 03/15/21 03/13/21   [name], MD  Vitamin D, Ergocalciferol, (DRISDOL) 1.25 MG (50000 UNIT) CAPS capsule Take 50,000 Units by mouth once a week. 03/30/20   [provider]    Family History Family History  Problem Relation Age of Onset   Osteoarthritis Mother    Neuropathy Mother    Diabetes Mother        pre-diabetes   Melanoma Mother    Prostate cancer Father    Diabetes Father    Hypertension Father    Gout Father     Social History Social History   Tobacco Use   Smoking status: Never   Smokeless tobacco: Never  Vaping Use   Vaping Use: Never used  Substance Use Topics   Alcohol use: Not Currently    Comment: rare   Drug use: Never     Allergies    Hydrocodone   Review of Systems Review of Systems  Constitutional:  Negative for appetite change, chills and fever.  HENT:  Negative for congestion, ear pain, rhinorrhea, sinus pressure, sinus pain and sore throat.   Eyes:  Positive for discharge, redness and itching. Negative for visual disturbance.  Respiratory:  Positive for cough. Negative for chest tightness, shortness of breath and wheezing.   Cardiovascular:  Negative for chest pain and palpitations.  Gastrointestinal:  Negative for abdominal pain, constipation, diarrhea, nausea and vomiting.  Genitourinary:  Negative for dysuria, frequency and urgency.  Musculoskeletal:  Negative for myalgias.  Neurological:  Negative for dizziness, weakness and headaches.  Psychiatric/Behavioral:  Negative for confusion.   All other  systems reviewed and are negative.   Physical Exam Triage Vital Signs ED Triage Vitals [12/20/21 0926]  Enc Vitals Group     BP 137/81     Pulse Rate 96     Resp 18     Temp 98.2 F (36.8 C)     Temp src      SpO2 97 %     Weight      Height      Head Circumference      Peak Flow      Pain Score      Pain Loc      Pain Edu?      Excl. in [place]?    No data found.  Updated Vital Signs BP 137/81   Pulse 96   Temp 98.2 F (36.8 C)   Resp 18   SpO2 97%   Visual Acuity Right Eye Distance:   Left Eye Distance:   Bilateral Distance:    Right Eye Near:   Left Eye Near:    Bilateral Near:     Physical Exam Vitals reviewed.  Constitutional:      General: She is not in acute distress.    Appearance: Normal appearance. She is not ill-appearing.  HENT:     Head: Normocephalic and atraumatic.     Right Ear: Tympanic membrane, ear canal and external ear normal. No tenderness. No middle ear effusion. There is no impacted cerumen. Tympanic membrane is not perforated, erythematous, retracted or bulging.     Left Ear: Tympanic membrane, ear canal and external ear normal. No tenderness.  No middle ear  effusion. There is no impacted cerumen. Tympanic membrane is not perforated, erythematous, retracted or bulging.     Nose: Nose normal. No congestion.     Mouth/Throat:     Mouth: Mucous membranes are moist.     Pharynx: Oropharynx is clear. Uvula midline. No oropharyngeal exudate or posterior oropharyngeal erythema.  Eyes:     General: Lids are normal. Lids are everted, no foreign bodies appreciated. Vision grossly intact. Gaze aligned appropriately. Allergic shiner present. No visual field deficit.       Right eye: No foreign body, discharge or hordeolum.        Left eye: No foreign body, discharge or hordeolum.     Extraocular Movements: Extraocular movements intact.     Right eye: Normal extraocular motion and no nystagmus.     Left eye: Normal extraocular motion and no nystagmus.     Conjunctiva/sclera:     Right eye: Right conjunctiva is injected. No chemosis, exudate or hemorrhage.    Left eye: Left conjunctiva is injected. No chemosis, exudate or hemorrhage.    Pupils: Pupils are equal, round, and reactive to light.     Visual Fields: Right eye visual fields normal and left eye visual fields normal.     Comments: Allergic shiners and trace conjunctival injection bilaterally. PERRLA, EOMI without pain. No orbital tenderness. Visual acuity grossly intact.   Cardiovascular:     Rate and Rhythm: Normal rate and regular rhythm.     Heart sounds: Normal heart sounds.  Pulmonary:     Effort: Pulmonary effort is normal.     Breath sounds: Normal breath sounds. No decreased breath sounds, wheezing, rhonchi or rales.  Abdominal:     Palpations: Abdomen is soft.     Tenderness: There is no abdominal tenderness. There is no guarding or rebound.  Lymphadenopathy:     Cervical: No cervical adenopathy.  Right cervical: No superficial cervical adenopathy.    Left cervical: No superficial cervical adenopathy.  Neurological:     General: No focal deficit present.     Mental Status: She is  alert and oriented to person, place, and time.  Psychiatric:        Mood and Affect: Mood normal.        Behavior: Behavior normal.        Thought Content: Thought content normal.        Judgment: Judgment normal.     UC Treatments / Results  Labs (all labs ordered are listed, but only abnormal results are displayed) Labs Reviewed  POCT RAPID STREP A (OFFICE)    EKG   Radiology No results found.  Procedures Procedures (including critical care time)  Medications Ordered in UC Medications - No data to display  Initial Impression / Assessment and Plan / UC Course  I have reviewed the triage vital signs and the nursing notes.  Pertinent labs & imaging results that were available during my care of the patient were reviewed by me and considered in my medical decision making (see chart for details).     This patient is a very pleasant 51 y.o. year old female presenting with allergic rhinitis.  Afebrile, nontachycardic.  Nontoxic appearing with no adventitious breath sounds.  No formal diagnosis of pulmonary disease per patient, but she takes [employer] inhaler daily.  Low concern for pneumonia, pneumothorax, PE as vital signs are stable and breath sounds clear throughout.  Suspect untreated allergic rhinitis explains to her symptoms.  Start Zyrtec, olopatadine, Flonase.  Short course of prednisone for reactive airway component. ED return precautions discussed. Patient verbalizes understanding and agreement. -Coding Level 4 for acute exacerbation of chronic condition and prescription drug management.  Strep negative, culture sent. .   Final Clinical Impressions(s) / UC Diagnoses   Final diagnoses:  Seasonal allergic rhinitis due to pollen  Allergic conjunctivitis of both eyes  Moderate persistent reactive airway disease with acute exacerbation     Discharge Instructions      -Prednisone, 2 pills taken at the same time for 5 days in a row.  Try taking this earlier in the  day as it can give you energy. Avoid NSAIDs like ibuprofen and alleve while taking this medication as they can increase your risk of stomach upset and even GI bleeding when in combination with a steroid. You can continue tylenol (acetaminophen) up to 1000mg  3x daily. -Olopatadine drops twice daily as needed for allergic conjunctivitis -Flonase nasal steroid: place 2 sprays into both nostrils in the morning and at bedtime for at least 7 days. Continue for longer if this is helping. -Pick up an over-the-counter antihistamine like zyrtec -Continue daily breo ellipta -Follow-up if symptoms worsen: cough, new shortness of  breath, new fevers, new chest pain, etc.      ED Prescriptions     Medication Sig Dispense Auth. Provider   predniSONE (DELTASONE) 20 MG tablet Take 2 tablets (40 mg total) by mouth daily for 5 days. Take with breakfast or lunch. Avoid NSAIDs (ibuprofen, etc) while taking this medication. 10 tablet Rhys Martini, PA-C   olopatadine (PATANOL) 0.1 % ophthalmic solution Place 1 drop into both eyes 2 (two) times daily. 5 mL Rhys Martini, PA-C      PDMP not reviewed this encounter.   Rhys Martini, PA-C 12/20/21 1006

## 2021-12-20 NOTE — Discharge Instructions (Addendum)
-  Prednisone, 2 pills taken at the same time for 5 days in a row.  Try taking this earlier in the day as it can give you energy. Avoid NSAIDs like ibuprofen and alleve while taking this medication as they can increase your risk of stomach upset and even GI bleeding when in combination with a steroid. You can continue tylenol (acetaminophen) up to 1000mg  3x daily. ?-Olopatadine drops twice daily as needed for allergic conjunctivitis ?-Flonase nasal steroid: place 2 sprays into both nostrils in the morning and at bedtime for at least 7 days. Continue for longer if this is helping. ?-Pick up an over-the-counter antihistamine like zyrtec ?-Continue daily breo ellipta ?-Follow-up if symptoms worsen: cough, new shortness of  breath, new fevers, new chest pain, etc.  ?

## 2021-12-20 NOTE — ED Triage Notes (Signed)
Pt presents with cough, runny nose, ST x 3 weeks. Today she woke up with her eyes red and matted shut. Her son tested positive for strep a few days ago.  ?

## 2021-12-22 LAB — CULTURE, GROUP A STREP (THRC)

## 2022-09-22 ENCOUNTER — Ambulatory Visit
Admission: EM | Admit: 2022-09-22 | Discharge: 2022-09-22 | Disposition: A | Discharge status: Home / Self Care | Payer: Managed Care, Other (non HMO) | Attending: Emergency Medicine | Admitting: Emergency Medicine

## 2022-09-22 DIAGNOSIS — J0100 Acute maxillary sinusitis, unspecified: Secondary | ICD-10-CM | POA: Insufficient documentation

## 2022-09-22 DIAGNOSIS — Z1152 Encounter for screening for COVID-19: Secondary | ICD-10-CM | POA: Insufficient documentation

## 2022-09-22 DIAGNOSIS — J209 Acute bronchitis, unspecified: Secondary | ICD-10-CM | POA: Diagnosis present

## 2022-09-22 MED ORDER — ALBUTEROL SULFATE HFA 108 (90 BASE) MCG/ACT IN AERS: 1.0000 | INHALATION_SPRAY | Freq: Four times a day (QID) | RESPIRATORY_TRACT | 0 refills | Status: AC | PRN

## 2022-09-22 MED ORDER — AMOXICILLIN-POT CLAVULANATE 875-125 MG PO TABS: 1.0000 | ORAL_TABLET | Freq: Two times a day (BID) | ORAL | 0 refills | Status: AC

## 2022-09-22 NOTE — ED Provider Notes (Signed)
[name]    CSN: [id number] Arrival date & time: 09/22/22  1133      History   Chief Complaint Chief Complaint  Patient presents with   Cough    HPI Lauren Phillips is a 52 y.o. female.  Patient presents with 5-day history of congestion, sinus pressure, sinus pain, ear pain, sore throat, cough.  She reports low-grade fever of 100 few days ago but none since.  She denies chest pain, shortness of breath, vomiting, or other symptoms.  OTC treatment attempted.  Patient uses [employer] Patter but does not currently have an albuterol inhaler.  The history is provided by the patient and medical records.    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   Diabetes mellitus without complication (HCC)    GERD (gastroesophageal reflux disease)    Gout    Hypothyroidism    Motion sickness    small ocean boats    Patient Active Problem List   Diagnosis Date Noted   Abdominal pain, epigastric    Gastroesophageal reflux disease without esophagitis    Screen for colon cancer    Iron deficiency anemia 02/17/2017   Carpal tunnel syndrome, left 02/17/2015   OA (osteoarthritis) of knee 04/12/2013   Bursitis, shoulder 11/27/2012   Sprain of shoulder, right 11/27/2012    Past Surgical History:  Procedure Laterality Date   ABDOMINAL HYSTERECTOMY     BARIATRIC SURGERY     CARPAL TUNNEL RELEASE Bilateral    CATARACT EXTRACTION, BILATERAL     COLONOSCOPY WITH PROPOFOL N/A 03/16/2021   Procedure: COLONOSCOPY WITH PROPOFOL;  Surgeon: Lucilla Lame, MD;  Location: [place];  Service: Endoscopy;  Laterality: N/A;   ESOPHAGOGASTRODUODENOSCOPY (EGD) WITH PROPOFOL N/A 03/16/2021   Procedure: ESOPHAGOGASTRODUODENOSCOPY (EGD) WITH PROPOFOL;  Surgeon: Lucilla Lame, MD;  Location: [place];  Service: Endoscopy;  Laterality: N/A;  Diabetic - oral meds   TONSILLECTOMY      OB History   No  obstetric history on file.      Home Medications    Prior to Admission medications   Medication Sig Start Date End Date Taking? Authorizing Provider  albuterol (VENTOLIN HFA) 108 (90 Base) MCG/ACT inhaler Inhale 1-2 puffs into the lungs every 6 (six) hours as needed. 09/22/22  Yes Sharion Balloon, NP  amoxicillin-clavulanate (AUGMENTIN) 875-125 MG tablet Take 1 tablet by mouth every 12 (twelve) hours for 10 days. 09/22/22 10/02/22 Yes Sharion Balloon, NP  acetaminophen (TYLENOL) 500 MG tablet Take 500 mg by mouth every 6 (six) hours as needed.    [provider]  allopurinol (ZYLOPRIM) 300 MG tablet Take by mouth. 10/01/05   [provider]  Ascorbic Acid (VITAMIN C ADULT GUMMIES PO) Take by mouth daily.    [provider]  BREO ELLIPTA 100-25 MCG/INH AEPB 1 puff daily. 02/07/21   [provider]  buPROPion (WELLBUTRIN SR) 150 MG 12 hr tablet Take 150 mg by mouth 2 (two) times daily. 01/25/21   [provider]  cetirizine (ZYRTEC ALLERGY) 10 MG tablet Take 1 tablet (10 mg total) by mouth daily. 06/16/20   Coral Spikes, DO  clonazePAM (KLONOPIN) 0.5 MG tablet Take 0.5 mg by mouth as needed for anxiety.    [provider]  cyclobenzaprine (FLEXERIL) 5 MG tablet Take 5 mg by mouth 3 (three) times daily as needed. 01/25/21  [provider]  Estradiol 10 MCG TABS vaginal tablet Place 1 tablet vaginally 2 (two) times a week. 07/31/21   [provider]  FARXIGA 10 MG TABS tablet Take 10 mg by mouth daily. 03/30/20   [provider]  Ferrous Sulfate (IRON PO) Take by mouth.    [provider]  ibuprofen (ADVIL) 800 MG tablet Take 800 mg by mouth every 8 (eight) hours as needed.    [provider]  ipratropium (ATROVENT) 0.06 % nasal spray Place 2 sprays into both nostrils 4 (four) times daily as needed for rhinitis. 06/16/20   Tommie Sams, DO  levothyroxine (SYNTHROID, LEVOTHROID) 50 MCG tablet TK 1 T PO QD  04/21/18   [provider]  meloxicam (MOBIC) 15 MG tablet Take 15 mg by mouth daily. 02/29/20   [provider]  metFORMIN (GLUCOPHAGE) 1000 MG tablet Take by mouth.    [provider]  methocarbamol (ROBAXIN) 500 MG tablet Take 1 tablet (500 mg total) by mouth 2 (two) times daily as needed for muscle spasms. 04/08/21   Mickie Bail, NP  olopatadine (PATANOL) 0.1 % ophthalmic solution Place 1 drop into both eyes 2 (two) times daily. 12/20/21   Rhys Martini, PA-C  omeprazole (PRILOSEC) 40 MG capsule Take 40 mg by mouth 2 (two) times daily. 02/28/20   [provider]  polyethylene glycol-electrolytes (GAVILYTE-N WITH FLAVOR PACK) 420 g solution Drink one 8 oz glass every 20 mins until entire container is finished starting at 5:00pm on 03/15/21 03/13/21   [name], MD  sertraline (ZOLOFT) 100 MG tablet Take 100 mg by mouth daily.    [provider]  Vitamin D, Ergocalciferol, (DRISDOL) 1.25 MG (50000 UNIT) CAPS capsule Take 50,000 Units by mouth once a week. 03/30/20   [provider]    Family History Family History  Problem Relation Age of Onset   Osteoarthritis Mother    Neuropathy Mother    Diabetes Mother        pre-diabetes   Melanoma Mother    Prostate cancer Father    Diabetes Father    Hypertension Father    Gout Father     Social History Social History   Tobacco Use   Smoking status: Never   Smokeless tobacco: Never  Vaping Use   Vaping Use: Never used  Substance Use Topics   Alcohol use: Not Currently    Comment: rare   Drug use: Never     Allergies   Hydrocodone   Review of Systems Review of Systems  Constitutional:  Negative for chills and fever.  HENT:  Positive for congestion, ear pain, postnasal drip, rhinorrhea, sinus pressure and sore throat.   Respiratory:  Positive for cough. Negative for shortness of breath.   Cardiovascular:  Negative for chest pain and palpitations.  Skin:  Negative for rash.   All other systems reviewed and are negative.    Physical Exam Triage Vital Signs ED Triage Vitals  Enc Vitals Group     BP 09/22/22 1417 124/84     Pulse Rate 09/22/22 1404 84     Resp 09/22/22 1404 18     Temp 09/22/22 1404 98 F (36.7 C)     Temp src --      SpO2 09/22/22 1404 96 %     Weight 09/22/22 1412 168 lb (76.2 kg)     Height 09/22/22 1412 5\' 4"  (1.626 m)     Head Circumference --  Peak Flow --      Pain Score 09/22/22 1412 3     Pain Loc --      Pain Edu? --      Excl. in GC? --    No data found.  Updated Vital Signs BP 124/84   Pulse 84   Temp 98 F (36.7 C)   Resp 18   Ht 5\' 4"  (1.626 m)   Wt 168 lb (76.2 kg)   SpO2 96%   BMI 28.84 kg/m   Visual Acuity Right Eye Distance:   Left Eye Distance:   Bilateral Distance:    Right Eye Near:   Left Eye Near:    Bilateral Near:     Physical Exam Vitals and nursing note reviewed.  Constitutional:      General: She is not in acute distress.    Appearance: Normal appearance. She is well-developed. She is not ill-appearing.  HENT:     Right Ear: Tympanic membrane normal.     Left Ear: Tympanic membrane normal.     Nose: Congestion and rhinorrhea present.     Mouth/Throat:     Mouth: Mucous membranes are moist.     Pharynx: Oropharynx is clear.  Eyes:     Conjunctiva/sclera: Conjunctivae normal.  Cardiovascular:     Rate and Rhythm: Normal rate and regular rhythm.     Heart sounds: Normal heart sounds.  Pulmonary:     Effort: Pulmonary effort is normal. No respiratory distress.     Breath sounds: Normal breath sounds.  Musculoskeletal:     Cervical back: Neck supple.  Skin:    General: Skin is warm and dry.  Neurological:     Mental Status: She is alert.  Psychiatric:        Mood and Affect: Mood normal.        Behavior: Behavior normal.      UC Treatments / Results  Labs (all labs ordered are listed, but only abnormal results are displayed) Labs Reviewed - No data to  display  EKG   Radiology No results found.  Procedures Procedures (including critical care time)  Medications Ordered in UC Medications - No data to display  Initial Impression / Assessment and Plan / UC Course  I have reviewed the triage vital signs and the nursing notes.  Pertinent labs & imaging results that were available during my care of the patient were reviewed by me and considered in my medical decision making (see chart for details).    Acute bronchitis and sinusitis.  Treating today with Augmentin.  Also prescribed albuterol inhaler as patient reports she has had to use this in the past and does not have currently.  Instructed patient to follow up with her PCP if her symptoms are not improving.  Education provided on bronchitis and sinusitis.  She agrees to plan of care.    Final Clinical Impressions(s) / UC Diagnoses   Final diagnoses:  Acute bronchitis, unspecified organism  Acute non-recurrent maxillary sinusitis     Discharge Instructions      Take the Augmentin as directed.  Follow up with your primary care provider if your symptoms are not improving.        ED Prescriptions     Medication Sig Dispense Auth. Provider   amoxicillin-clavulanate (AUGMENTIN) 875-125 MG tablet Take 1 tablet by mouth every 12 (twelve) hours for 10 days. 20 tablet , NP   albuterol (VENTOLIN HFA) 108 (90 Base) MCG/ACT inhaler Inhale 1-2 puffs into  the lungs every 6 (six) hours as needed. 18 g Sharion Balloon, NP      PDMP not reviewed this encounter.   Sharion Balloon, NP 09/22/22 [phone]

## 2022-09-22 NOTE — ED Triage Notes (Signed)
Patient to Urgent Care with complaints of dry cough, ear ache, and sinus pain, nasal congestion/ drainage/ sore throat. Reports wheezing at times.   Symptoms started five days ago. Reports feeling similar to how she has felt with walking pneumonia before.  Denies any known fevers, max temp 100.

## 2022-09-22 NOTE — Discharge Instructions (Addendum)
Take the Augmentin as directed.  Follow up with your primary care provider if your symptoms are not improving.    

## 2022-09-23 LAB — SARS CORONAVIRUS 2 (TAT 6-24 HRS): SARS Coronavirus 2: NEGATIVE

## 2024-08-20 ENCOUNTER — Encounter: Payer: Self-pay | Admitting: Rheumatology

## 2024-08-20 ENCOUNTER — Other Ambulatory Visit: Payer: Self-pay | Admitting: Rheumatology

## 2024-08-20 DIAGNOSIS — M5412 Radiculopathy, cervical region: Secondary | ICD-10-CM

## 2024-08-20 DIAGNOSIS — M479 Spondylosis, unspecified: Secondary | ICD-10-CM

## 2024-08-20 DIAGNOSIS — M542 Cervicalgia: Secondary | ICD-10-CM

## 2024-08-20 DIAGNOSIS — M5416 Radiculopathy, lumbar region: Secondary | ICD-10-CM

## 2024-08-28 ENCOUNTER — Inpatient Hospital Stay: Admission: RE | Admit: 2024-08-28 | Discharge: 2024-08-28 | Attending: Rheumatology | Admitting: Rheumatology

## 2024-08-28 DIAGNOSIS — M542 Cervicalgia: Secondary | ICD-10-CM

## 2024-08-28 DIAGNOSIS — M5412 Radiculopathy, cervical region: Secondary | ICD-10-CM

## 2024-08-28 DIAGNOSIS — M479 Spondylosis, unspecified: Secondary | ICD-10-CM

## 2024-10-02 ENCOUNTER — Encounter: Payer: Self-pay | Admitting: Emergency Medicine

## 2024-10-02 ENCOUNTER — Ambulatory Visit
Admission: EM | Admit: 2024-10-02 | Discharge: 2024-10-02 | Disposition: A | Discharge status: Home / Self Care | Attending: Family Medicine | Admitting: Family Medicine

## 2024-10-02 DIAGNOSIS — J0100 Acute maxillary sinusitis, unspecified: Secondary | ICD-10-CM | POA: Diagnosis not present

## 2024-10-02 DIAGNOSIS — J069 Acute upper respiratory infection, unspecified: Secondary | ICD-10-CM

## 2024-10-02 LAB — POC SOFIA SARS ANTIGEN FIA: SARS Coronavirus 2 Ag: NEGATIVE

## 2024-10-02 MED ORDER — FLUCONAZOLE 150 MG PO TABS: 150.0000 mg | ORAL_TABLET | Freq: Once | ORAL | 0 refills | Status: AC

## 2024-10-02 MED ORDER — AMOXICILLIN-POT CLAVULANATE 875-125 MG PO TABS: 1.0000 | ORAL_TABLET | Freq: Two times a day (BID) | ORAL | 0 refills | Status: AC

## 2024-10-02 NOTE — ED Triage Notes (Signed)
 Patient c/o watery eyes, nasal congestion, sinus pain and pressure that started 3 days ago.  Patient reports dry cough.  Patient denies fevers.

## 2024-10-02 NOTE — ED Provider Notes (Signed)
 " [facility]    CSN: [id number] Arrival date & time: 10/02/24  0940      History   Chief Complaint Chief Complaint  Patient presents with   Facial Pain   Nasal Congestion    HPI Lauren Phillips is a 54 y.o. female.   HPI  History obtained from the patient. Lauren Phillips presents for facial pain, headache,  nasal congestion, pain in her teeth on the right side of her face, right ear pain with burning sensation inside my sinus area.  No fever, body aches, sore throat, vomiting and diarrhea. Has dry cough and it hurts to take a deep breathe.  Nothing taken for symptoms today but has been taking DayQuil, nasal spray decongestant.        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   Diabetes mellitus without complication (HCC)    GERD (gastroesophageal reflux disease)    Gout    Hypothyroidism    Motion sickness    small ocean boats    Patient Active Problem List   Diagnosis Date Noted   Abdominal pain, epigastric    Gastroesophageal reflux disease without esophagitis    Screen for colon cancer    Iron deficiency anemia 02/17/2017   Carpal tunnel syndrome, left 02/17/2015   OA (osteoarthritis) of knee 04/12/2013   Bursitis, shoulder 11/27/2012   Sprain of shoulder, right 11/27/2012    Past Surgical History:  Procedure Laterality Date   ABDOMINAL HYSTERECTOMY     BARIATRIC SURGERY     CARPAL TUNNEL RELEASE Bilateral    CATARACT EXTRACTION, BILATERAL     COLONOSCOPY WITH PROPOFOL  N/A 03/16/2021   Procedure: COLONOSCOPY WITH PROPOFOL ;  Surgeon: Jinny Carmine, MD;  Location: [facility];  Service: Endoscopy;  Laterality: N/A;   ESOPHAGOGASTRODUODENOSCOPY (EGD) WITH PROPOFOL  N/A 03/16/2021   Procedure: ESOPHAGOGASTRODUODENOSCOPY (EGD) WITH PROPOFOL ;  Surgeon: Jinny Carmine, MD;  Location: [facility];  Service: Endoscopy;  Laterality: N/A;  Diabetic - oral  meds   TONSILLECTOMY      OB History   No obstetric history on file.      Home Medications    Prior to Admission medications  Medication Sig Start Date End Date Taking? Authorizing Provider  amoxicillin -clavulanate (AUGMENTIN ) 875-125 MG tablet Take 1 tablet by mouth every 12 (twelve) hours. 10/02/24  Yes Sally-Ann Cutbirth, DO  fluconazole  (DIFLUCAN ) 150 MG tablet Take 1 tablet (150 mg total) by mouth once for 1 dose. 10/02/24 10/02/24 Yes Kaiyon Hynes, DO  acetaminophen (TYLENOL) 500 MG tablet Take 500 mg by mouth every 6 (six) hours as needed.    [provider]  albuterol  (VENTOLIN  HFA) 108 (90 Base) MCG/ACT inhaler Inhale 1-2 puffs into the lungs every 6 (six) hours as needed. 09/22/22   Corlis Burnard DEL, NP  allopurinol (ZYLOPRIM) 300 MG tablet Take by mouth. 10/01/05   [provider]  Ascorbic Acid (VITAMIN C ADULT GUMMIES PO) Take by mouth daily.    [provider]  BREO ELLIPTA 100-25 MCG/INH AEPB 1 puff daily. 02/07/21   [provider]  buPROPion (WELLBUTRIN SR) 150 MG 12 hr tablet Take 150 mg by mouth 2 (two) times daily. 01/25/21   [provider]  cetirizine  (ZYRTEC  ALLERGY) 10 MG tablet Take 1 tablet (10 mg total) by mouth daily. 06/16/20   Cook, Jayce G, DO  clonazePAM (KLONOPIN) 0.5 MG tablet Take  0.5 mg by mouth as needed for anxiety.    [provider]  cyclobenzaprine (FLEXERIL) 5 MG tablet Take 5 mg by mouth 3 (three) times daily as needed. 01/25/21   [provider]  Estradiol 10 MCG TABS vaginal tablet Place 1 tablet vaginally 2 (two) times a week. 07/31/21   [provider]  FARXIGA 10 MG TABS tablet Take 10 mg by mouth daily. 03/30/20   [provider]  Ferrous Sulfate (IRON PO) Take by mouth.    [provider]  ibuprofen (ADVIL) 800 MG tablet Take 800 mg by mouth every 8 (eight) hours as needed.    [provider]  ipratropium (ATROVENT ) 0.06 % nasal spray Place 2 sprays into  both nostrils 4 (four) times daily as needed for rhinitis. 06/16/20   Cook, Jayce G, DO  levothyroxine (SYNTHROID, LEVOTHROID) 50 MCG tablet TK 1 T PO QD 04/21/18   [provider]  meloxicam  (MOBIC ) 15 MG tablet Take 15 mg by mouth daily. 02/29/20   [provider]  metFORMIN (GLUCOPHAGE) 1000 MG tablet Take by mouth.    [provider]  methocarbamol  (ROBAXIN ) 500 MG tablet Take 1 tablet (500 mg total) by mouth 2 (two) times daily as needed for muscle spasms. 04/08/21   Corlis Burnard DEL, NP  olopatadine  (PATANOL) 0.1 % ophthalmic solution Place 1 drop into both eyes 2 (two) times daily. 12/20/21   Graham, Laura E, PA-C  omeprazole (PRILOSEC) 40 MG capsule Take 40 mg by mouth 2 (two) times daily. 02/28/20   [provider]  polyethylene glycol-electrolytes (GAVILYTE-N WITH FLAVOR PACK) 420 g solution Drink one 8 oz glass every 20 mins until entire container is finished starting at 5:00pm on 03/15/21 03/13/21   Jinny Carmine, MD  sertraline (ZOLOFT) 100 MG tablet Take 100 mg by mouth daily.    [provider]  Vitamin D, Ergocalciferol, (DRISDOL) 1.25 MG (50000 UNIT) CAPS capsule Take 50,000 Units by mouth once a week. 03/30/20   [provider]    Family History Family History  Problem Relation Age of Onset   Osteoarthritis Mother    Neuropathy Mother    Diabetes Mother        pre-diabetes   Melanoma Mother    Prostate cancer Father    Diabetes Father    Hypertension Father    Gout Father     Social History Social History[1]   Allergies   Hydrocodone   Review of Systems Review of Systems: negative unless otherwise stated in HPI.      Physical Exam Triage Vital Signs ED Triage Vitals  Encounter Vitals Group     BP 10/02/24 1004 132/66     Girls Systolic BP Percentile --      Girls Diastolic BP Percentile --      Boys Systolic BP Percentile --      Boys Diastolic BP Percentile --      Pulse Rate 10/02/24 1004 85     Resp 10/02/24  1004 15     Temp 10/02/24 1004 98.4 F (36.9 C)     Temp Source 10/02/24 1004 Oral     SpO2 10/02/24 1004 97 %     Weight 10/02/24 1001 167 lb 15.9 oz (76.2 kg)     Height 10/02/24 1001 5' 4 (1.626 m)     Head Circumference --      Peak Flow --      Pain Score 10/02/24 1001 8     Pain  Loc --      Pain Education --      Exclude from Growth Chart --    No data found.  Updated Vital Signs BP 132/66 (BP Location: Left Arm)   Pulse 85   Temp 98.4 F (36.9 C) (Oral)   Resp 15   Ht 5' 4 (1.626 m)   Wt 76.2 kg   SpO2 97%   BMI 28.84 kg/m   Visual Acuity Right Eye Distance:   Left Eye Distance:   Bilateral Distance:    Right Eye Near:   Left Eye Near:    Bilateral Near:     Physical Exam GEN:     alert, non-toxic appearing female in no distress    HENT:  mucus membranes moist, oropharyngeal without lesions or erythema, no tonsillar hypertrophy or exudates,  moderate erythematous edematous turbinates, +nasal discharge, right maxillary sinus TTP, bilateral TM normal EYES:   no scleral injection or discharge NECK:  good ROM, no lymphadenopathy RESP:  no increased work of breathing, clear to auscultation bilaterally CVS:   regular rate and rhythm Skin:   warm and dry    UC Treatments / Results  Labs (all labs ordered are listed, but only abnormal results are displayed) Labs Reviewed  POC SOFIA SARS ANTIGEN FIA - Normal    EKG   Radiology No results found.   Procedures Procedures (including critical care time)  Medications Ordered in UC Medications - No data to display  Initial Impression / Assessment and Plan / UC Course  I have reviewed the triage vital signs and the nursing notes.  Pertinent labs & imaging results that were available during my care of the patient were reviewed by me and considered in my medical decision making (see chart for details).       Pt is a 54 y.o. female who presents for 5 days of respiratory symptoms. Ayasha is afebrile  here without recent antipyretics. Satting well on room air. Overall pt is non-toxic appearing, well hydrated, without respiratory distress. Pulmonary exam is unremarkable.  POC COVID was negative. She has maxillary sinus tenderness on the right. Given her symptoms are worsening, will cover for bacterial cause with Augmentin  as below. Diflucan  for antibiotic associated yeast infection prevention.  Discussed symptomatic treatment.  Typical duration of symptoms discussed.   Return and ED precautions given and voiced understanding. Discussed MDM, treatment plan and plan for follow-up with patient  who agrees with plan.     Final Clinical Impressions(s) / UC Diagnoses   Final diagnoses:  Acute non-recurrent maxillary sinusitis     Discharge Instructions      You have a sinus infection.  You COVID test is negative.  Take Advil or Tylenol as needed for pain.  Stop by the pharmacy to pick up your prescriptions.  Follow up with your primary care provider or return to the urgent care, if not improving.       ED Prescriptions     Medication Sig Dispense Auth. Provider   amoxicillin -clavulanate (AUGMENTIN ) 875-125 MG tablet Take 1 tablet by mouth every 12 (twelve) hours. 14 tablet Aashi Derrington, DO   fluconazole  (DIFLUCAN ) 150 MG tablet Take 1 tablet (150 mg total) by mouth once for 1 dose. 1 tablet Kriste Berth, DO      PDMP not reviewed this encounter.     [1]  Social History Tobacco Use   Smoking status: Never   Smokeless tobacco: Never  Vaping Use   Vaping status: Never Used  Substance  Use Topics   Alcohol use: Not Currently    Comment: rare   Drug use: Never     Amere Iott, DO 10/02/24 1100  "

## 2024-10-02 NOTE — Discharge Instructions (Signed)
 You have a sinus infection.  You COVID test is negative.  Take Advil or Tylenol as needed for pain.  Stop by the pharmacy to pick up your prescriptions.  Follow up with your primary care provider or return to the urgent care, if not improving.
